# Patient Record
Sex: Female | Born: 1985 | Hispanic: Yes | Marital: Single | Smoking: Never smoker
Health system: Southern US, Community
[De-identification: ages and names within clinical notes are randomized; demographics above are authoritative.]

## PROBLEM LIST (undated history)

## (undated) ENCOUNTER — Inpatient Hospital Stay (HOSPITAL_COMMUNITY): Payer: Self-pay

## (undated) DIAGNOSIS — F419 Anxiety disorder, unspecified: Secondary | ICD-10-CM

## (undated) DIAGNOSIS — O139 Gestational [pregnancy-induced] hypertension without significant proteinuria, unspecified trimester: Secondary | ICD-10-CM

## (undated) DIAGNOSIS — F329 Major depressive disorder, single episode, unspecified: Secondary | ICD-10-CM

## (undated) HISTORY — PX: OVARIAN CYST REMOVAL: SHX89

## (undated) HISTORY — DX: Gestational (pregnancy-induced) hypertension without significant proteinuria, unspecified trimester: O13.9

## (undated) HISTORY — PX: APPENDECTOMY: SHX54

---

## 2004-08-18 ENCOUNTER — Inpatient Hospital Stay (HOSPITAL_COMMUNITY): Admission: AD | Admit: 2004-08-18 | Discharge: 2004-08-18 | Payer: Self-pay | Admitting: *Deleted

## 2004-10-03 ENCOUNTER — Ambulatory Visit (HOSPITAL_COMMUNITY): Admission: RE | Admit: 2004-10-03 | Discharge: 2004-10-03 | Payer: Self-pay | Admitting: Obstetrics & Gynecology

## 2004-12-30 ENCOUNTER — Ambulatory Visit: Payer: Self-pay | Admitting: Family Medicine

## 2004-12-30 ENCOUNTER — Inpatient Hospital Stay (HOSPITAL_COMMUNITY): Admission: AD | Admit: 2004-12-30 | Discharge: 2004-12-31 | Payer: Self-pay | Admitting: Obstetrics and Gynecology

## 2005-02-28 ENCOUNTER — Inpatient Hospital Stay (HOSPITAL_COMMUNITY): Admission: AD | Admit: 2005-02-28 | Discharge: 2005-02-28 | Payer: Self-pay | Admitting: Obstetrics & Gynecology

## 2005-02-28 ENCOUNTER — Ambulatory Visit: Payer: Self-pay | Admitting: *Deleted

## 2005-03-02 ENCOUNTER — Ambulatory Visit: Payer: Self-pay | Admitting: Family Medicine

## 2005-03-02 ENCOUNTER — Inpatient Hospital Stay (HOSPITAL_COMMUNITY): Admission: AD | Admit: 2005-03-02 | Discharge: 2005-03-05 | Payer: Self-pay | Admitting: *Deleted

## 2005-04-28 ENCOUNTER — Emergency Department (HOSPITAL_COMMUNITY): Admission: EM | Admit: 2005-04-28 | Discharge: 2005-04-28 | Payer: Self-pay | Admitting: Emergency Medicine

## 2006-01-28 ENCOUNTER — Observation Stay (HOSPITAL_COMMUNITY): Admission: EM | Admit: 2006-01-28 | Discharge: 2006-01-29 | Payer: Self-pay | Admitting: Emergency Medicine

## 2007-07-02 ENCOUNTER — Ambulatory Visit (HOSPITAL_COMMUNITY): Admission: RE | Admit: 2007-07-02 | Discharge: 2007-07-02 | Payer: Self-pay | Admitting: Family Medicine

## 2008-02-22 ENCOUNTER — Inpatient Hospital Stay (HOSPITAL_COMMUNITY): Admission: AD | Admit: 2008-02-22 | Discharge: 2008-02-24 | Payer: Self-pay | Admitting: Obstetrics

## 2008-08-11 ENCOUNTER — Encounter: Payer: Self-pay | Admitting: Sports Medicine

## 2008-08-11 ENCOUNTER — Ambulatory Visit: Payer: Self-pay | Admitting: Family Medicine

## 2008-08-11 ENCOUNTER — Inpatient Hospital Stay (HOSPITAL_COMMUNITY): Admission: AD | Admit: 2008-08-11 | Discharge: 2008-08-11 | Payer: Self-pay | Admitting: Family

## 2008-08-11 LAB — CONVERTED CEMR LAB
Antibody Screen: NEGATIVE
Basophils Absolute: 0 10*3/uL (ref 0.0–0.1)
Basophils Relative: 0 % (ref 0–1)
Eosinophils Absolute: 0.1 10*3/uL (ref 0.0–0.7)
Eosinophils Relative: 1 % (ref 0–5)
HCT: 34.4 % — ABNORMAL LOW (ref 36.0–46.0)
Hemoglobin: 12.2 g/dL (ref 12.0–15.0)
Hepatitis B Surface Ag: NEGATIVE
Lymphocytes Relative: 11 % — ABNORMAL LOW (ref 12–46)
Lymphs Abs: 1 K/uL (ref 0.7–4.0)
MCHC: 35.5 g/dL (ref 30.0–36.0)
MCV: 83.9 fL (ref 78.0–100.0)
Monocytes Absolute: 0.4 K/uL (ref 0.1–1.0)
Monocytes Relative: 5 % (ref 3–12)
Neutro Abs: 6.9 10*3/uL (ref 1.7–7.7)
Neutrophils Relative %: 82 % — ABNORMAL HIGH (ref 43–77)
Platelets: 183 K/uL (ref 150–400)
RBC: 4.1 M/uL (ref 3.87–5.11)
RDW: 15.1 % (ref 11.5–15.5)
Rh Type: POSITIVE
Rubella: 309.2 [IU]/mL — ABNORMAL HIGH
Sickle Cell Screen: NEGATIVE
WBC: 8.4 10*3/microliter (ref 4.0–10.5)

## 2008-08-13 ENCOUNTER — Inpatient Hospital Stay (HOSPITAL_COMMUNITY): Admission: AD | Admit: 2008-08-13 | Discharge: 2008-08-13 | Payer: Self-pay | Admitting: Obstetrics & Gynecology

## 2008-09-07 ENCOUNTER — Ambulatory Visit: Payer: Self-pay | Admitting: Obstetrics and Gynecology

## 2008-09-07 ENCOUNTER — Encounter: Payer: Self-pay | Admitting: Obstetrics and Gynecology

## 2008-09-07 LAB — CONVERTED CEMR LAB: hCG, Beta Chain, Quant, S: 70.7 milliintl units/mL

## 2008-09-21 ENCOUNTER — Ambulatory Visit: Payer: Self-pay | Admitting: Obstetrics and Gynecology

## 2010-03-02 IMAGING — US US OB TRANSVAGINAL
1 series · 14 of 28 positions shown · non-contrast
Comparison: 08/11/2008

CLINICAL DATA: Recent miscarriage after fetal demise.  Heavy
vaginal bleeding.

TRANSVAGINAL OB ULTRASOUND
TECHNIQUE: Transvaginal ultrasound was performed for evaluation of
the gestation as well as the maternal uterus and adnexal regions.

[Series 1: us ob transvaginal · 14 of 37 slices shown]
[im 2/37]
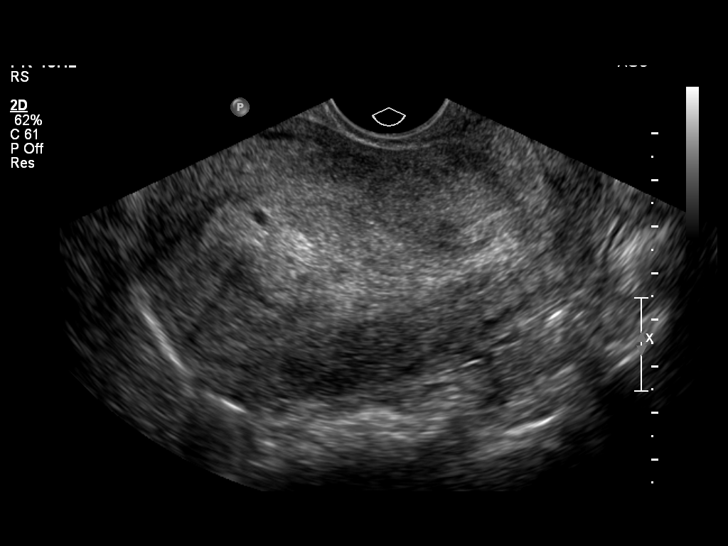
[im 5/37]
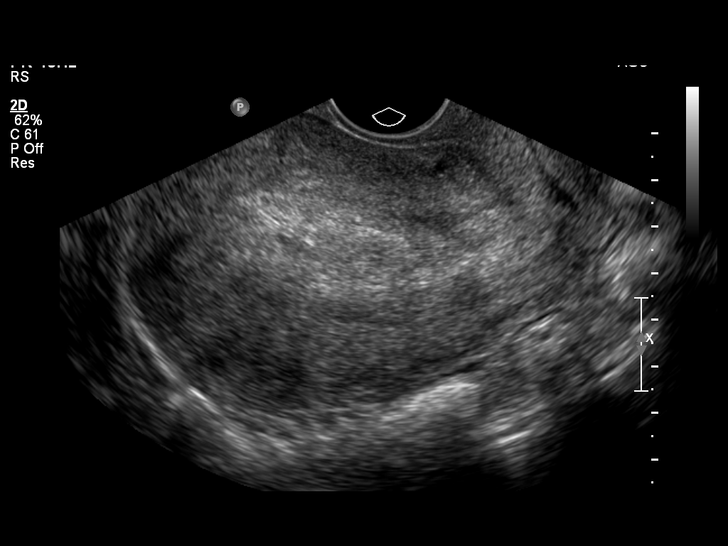
[im 7/37]
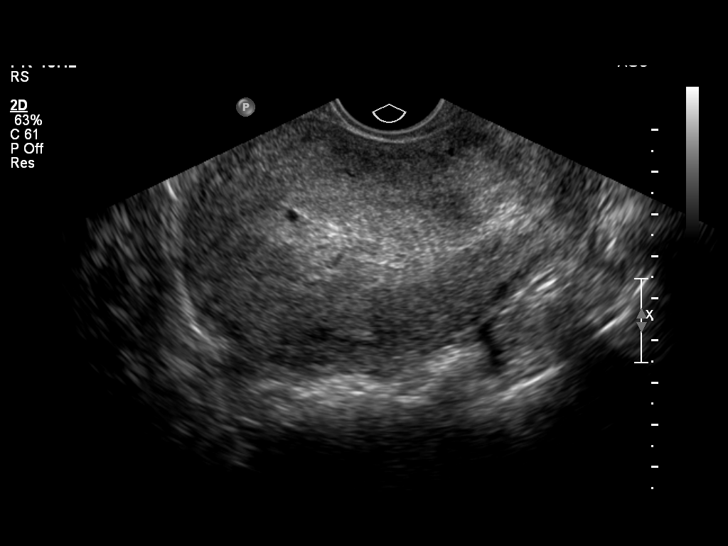
[im 10/37]
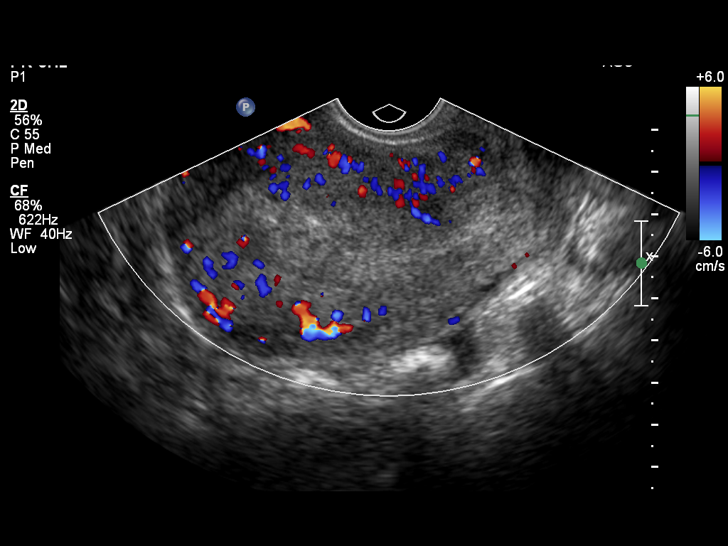
[im 13/37]
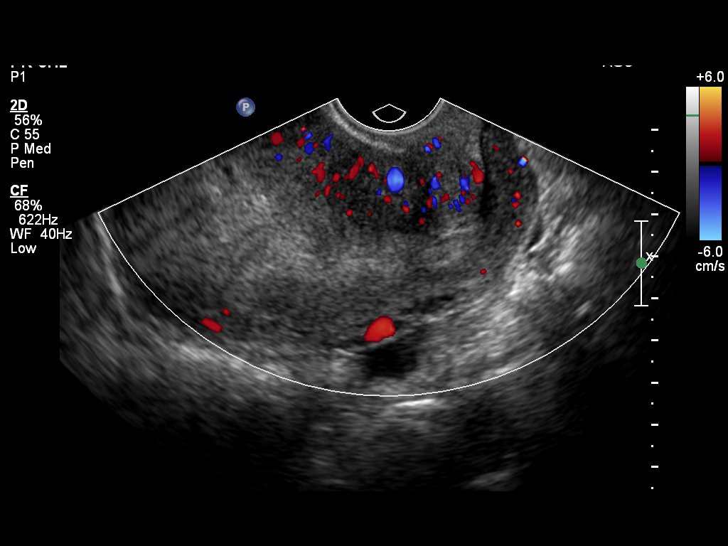
[im 15/37]
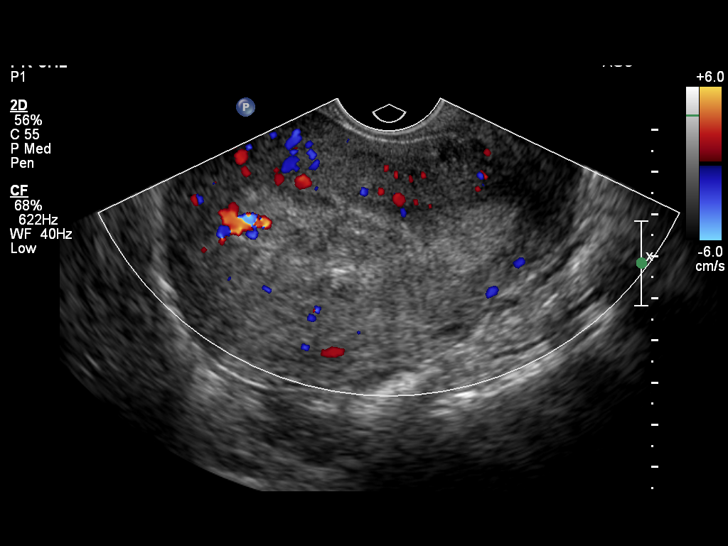
[im 18/37]
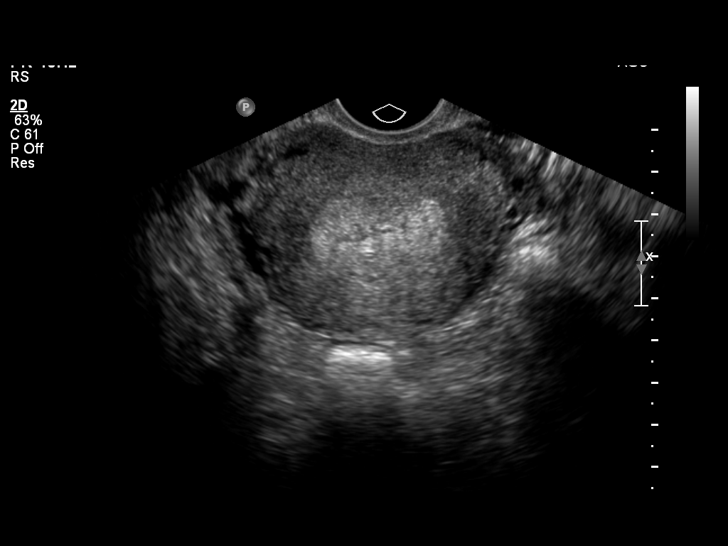
[im 21/37]
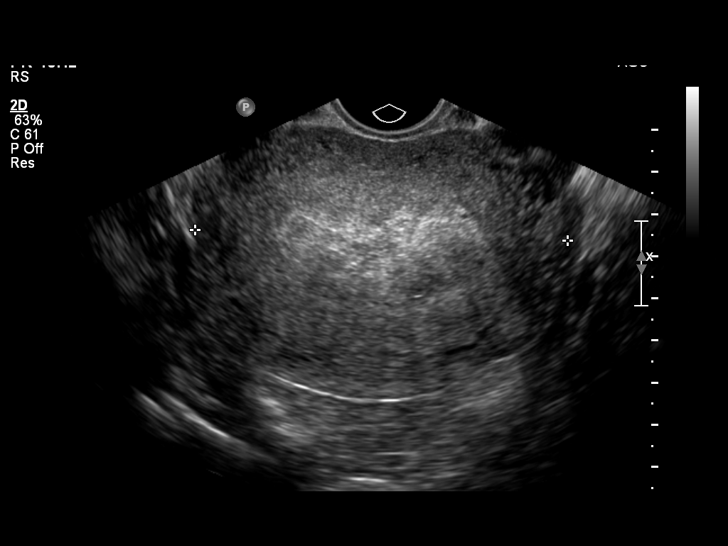
[im 23/37]
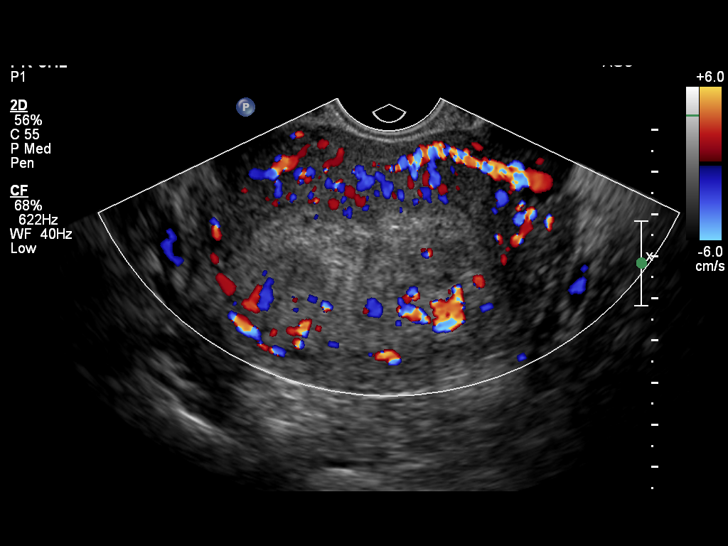
[im 26/37]
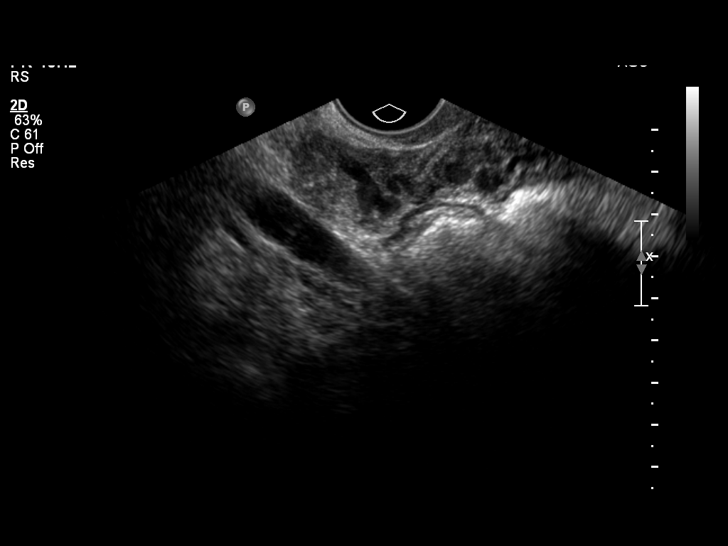
[im 29/37]
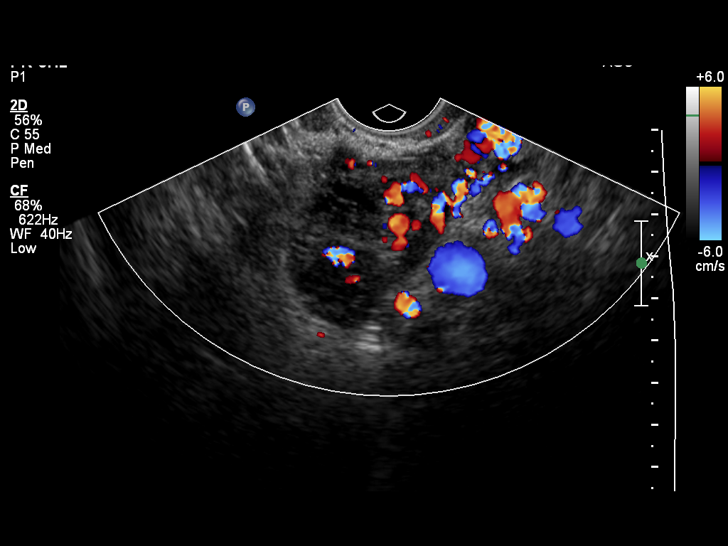
[im 31/37]
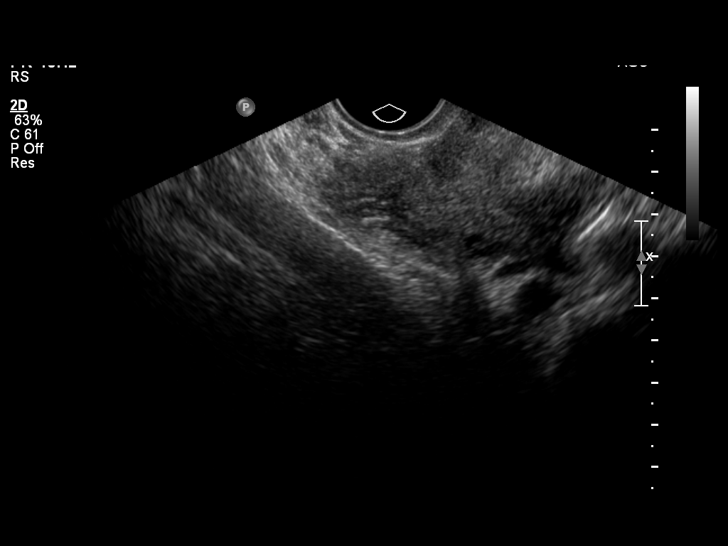
[im 34/37]
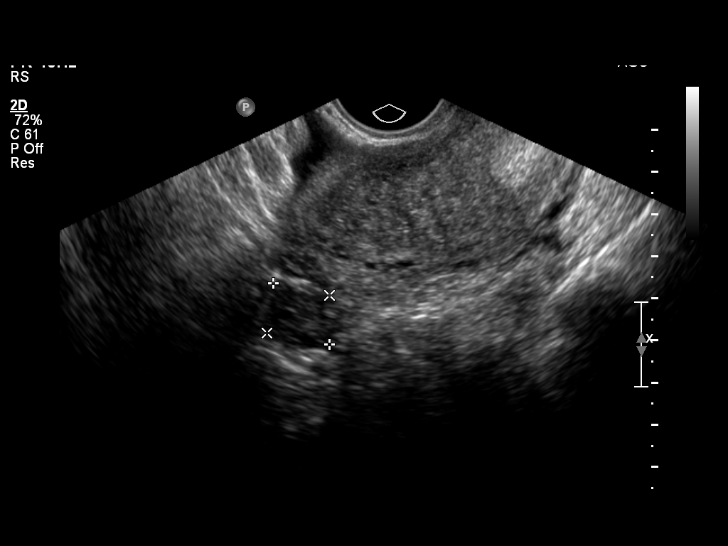
[im 37/37]
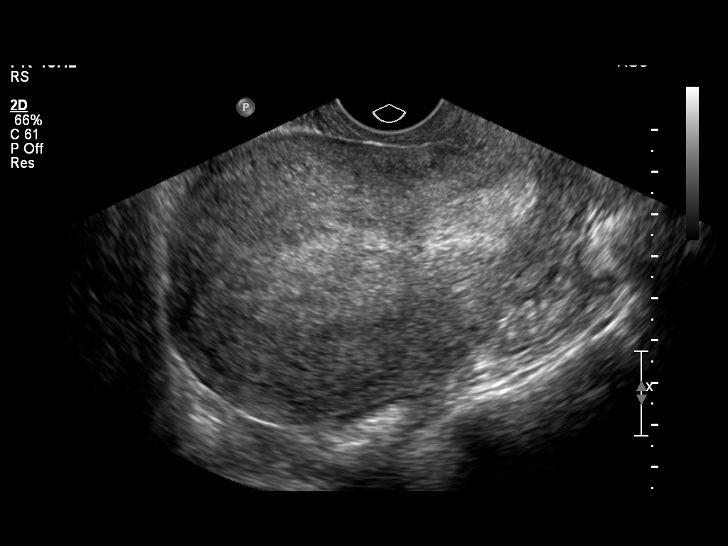

[14 of 28 positions shown; findings below may reference images not displayed]

FINDINGS: No intrauterine gestational sac is identified.  The
endometrial canal is thickened and heterogeneous, measuring up to
2.2 cm in diameter.  There is no convincing ultrasound evidence for
retained products of conception.

The ovaries are sonographically normal bilaterally.  No appreciable
fluid in the cul-de-sac.
IMPRESSION: Thickened heterogeneous endometrium likely represents blood
products.  No convincing evidence for retained products of
conception.

## 2010-05-20 ENCOUNTER — Encounter: Payer: Self-pay | Admitting: Family Medicine

## 2010-08-08 LAB — CBC
HCT: 37.1 % (ref 36.0–46.0)
Hemoglobin: 13 g/dL (ref 12.0–15.0)
MCHC: 35.7 g/dL (ref 30.0–36.0)
MCHC: 34.9 g/dL (ref 30.0–36.0)
MCV: 89.7 fL (ref 78.0–100.0)
MCV: 89.8 fL (ref 78.0–100.0)
Platelets: 179 10*3/uL (ref 150–400)
RBC: 3.58 MIL/uL — ABNORMAL LOW (ref 3.87–5.11)
RBC: 4.13 MIL/uL (ref 3.87–5.11)
WBC: 9.5 10*3/uL (ref 4.0–10.5)
WBC: 8.7 10*3/uL (ref 4.0–10.5)

## 2010-08-08 LAB — DIFFERENTIAL
Basophils Relative: 0 % (ref 0–1)
Eosinophils Absolute: 0.1 10*3/uL (ref 0.0–0.7)
Eosinophils Relative: 1 % (ref 0–5)
Lymphs Abs: 1.1 10*3/uL (ref 0.7–4.0)
Monocytes Absolute: 0.4 10*3/uL (ref 0.1–1.0)
Monocytes Relative: 4 % (ref 3–12)
Neutrophils Relative %: 82 % — ABNORMAL HIGH (ref 43–77)

## 2010-08-08 LAB — URINALYSIS, ROUTINE W REFLEX MICROSCOPIC
Glucose, UA: NEGATIVE mg/dL
Ketones, ur: NEGATIVE mg/dL
Nitrite: NEGATIVE
Protein, ur: NEGATIVE mg/dL
Urobilinogen, UA: 0.2 mg/dL (ref 0.0–1.0)

## 2010-08-08 LAB — GC/CHLAMYDIA PROBE AMP, GENITAL: GC Probe Amp, Genital: NEGATIVE

## 2010-08-09 LAB — POCT PREGNANCY, URINE: Preg Test, Ur: POSITIVE

## 2010-09-11 NOTE — H&P (Signed)
Connie Boyd, Connie Boyd       ACCOUNT NO.:  [id number]   MEDICAL RECORD NO.:  [id number]          PATIENT TYPE:  INP   LOCATION:  9169                          FACILITY:  [facility]   PHYSICIAN:  Roseanna Rainbow, M.D.DATE OF BIRTH:  09-30-1985   DATE OF ADMISSION:  02/22/2008  DATE OF DISCHARGE:                              HISTORY & PHYSICAL   CHIEF COMPLAINT:  The patient is a 25 year old para 1 with an estimated  date of confinement of February 29, 2008, with an intrauterine pregnancy  at 39 weeks complaining of contractions.   HISTORY OF PRESENT ILLNESS:  Please see the above.   ALLERGIES:  No known drug allergies.   MEDICATIONS:  None.   PRENATAL LABS:  Blood type O+, antibody screen negative.  Urine culture  and sensitivity, enterococcus, GC probe negative.  Chlamydia probe  negative.  1-hour GTT 97.  Hepatitis B surface antigen negative.  Hematocrit 34.4, hemoglobin 11.8, HIV nonreactive.  Platelets 214,000.  RPR nonreactive, rubella immune, Chlamydia negative, Varicella immune.  GBS negative on February 01, 2008.   PAST OBSTETRICAL HISTORY:  In November 2006, she delivered a live born  female, vaginal delivery full term, 5 pounds 11 ounces.   PAST GYNECOLOGIC HISTORY:  Noncontributory.   PAST MEDICAL HISTORY:  No significant history of medical diseases.   PAST SURGICAL HISTORY:  Appendectomy.   SOCIAL HISTORY:  She is single, living with her significant other.  Does  not give any significant history of alcohol usage, has no significant  smoking history.  Denies illicit drug use.   FAMILY HISTORY:  No major illnesses known.   PHYSICAL EXAM:  Vital signs stable, afebrile.  Fetal heart tracing  reassuring.  Tocodynamometer uterine contractions every 2-4 minutes.  Sterile vaginal exam per the RN: Cervix 3-4 cm dilated, 90% effaced.   ASSESSMENT:  Primipara at term, early labor.  Fetal heart tracing  consistent with fetal well being.   PLAN:  Admission, expectant  management.      Roseanna Rainbow, M.D.  Electronically Signed     LAJ/MEDQ  D:  02/22/2008  T:  02/22/2008  Job:  [id number]

## 2010-09-11 NOTE — Group Therapy Note (Signed)
NAME:  Connie Boyd, Connie Boyd NO.:  [id number]   MEDICAL RECORD NO.:  [id number]          PATIENT TYPE:  WOC   LOCATION:  [facility]                   FACILITY:  [facility]   PHYSICIAN:  Argentina Donovan, MD        DATE OF BIRTH:  [birth year]   DATE OF SERVICE:  09/07/2008                                  CLINIC NOTE   The patient is a 25 year old Spanish-speaking Hispanic female, gravida  3, para 2 0 1 2, who was seen in the [facility] last on April 17,2010 having  had what appeared to be complete AB following her previous visit with an  empty uterus.  She comes in today for follow-up.  She has not had a Pap  smear in several years.  Other than that she has decided on using the  Mirena IUD. Will put in an application for one of those and in the  interim she is going to use the Depo-Provera.  We have discussed the IUD  and Depo-Provera with the patient, and she seems to understand the  possible complications and pros and cons.   PHYSICAL EXAMINATION:  ABDOMEN: On examination her abdomen is soft,  flat, nontender.  No masses or organomegaly.  GENITOURINARY:  External genitalia is normal.  BUS is within normal  limits.  Vagina is clean with a moderate amount of pinkish blood within  the vaginal vault.  The cervix shows a very small amount of spotting  type of discharge.  The cervix is slightly fish mouth. Pap smear was  taken.  Uterus on bimanual is anterior and of normal size, shape,  consistency, and the cul-de-sac is free.   IMPRESSION:  Complete abortion.   PLAN:  Depo-Provera today and application in for a Mirena IUD.           ______________________________  Argentina Donovan, MD     PR/MEDQ  D:  09/07/2008  T:  09/07/2008  Job:  [id number]

## 2010-09-14 NOTE — Op Note (Signed)
NAMENANDA, BITTICK       ACCOUNT NO.:  [id number]   MEDICAL RECORD NO.:  [id number]          PATIENT TYPE:  INP   LOCATION:  9107                          [facility]ACILITY:  [facility]   PHYSICIAN:  Tanya S. Shawnie Pons, M.D.   DATE OF BIRTH:  1985/06/04   DATE OF PROCEDURE:  03/03/2005  DATE OF DISCHARGE:                                 OPERATIVE REPORT   OPERATION/PROCEDURE:  Vacuum-assisted delivery.   PREOPERATIVE DIAGNOSES:  1.  Fetal tachycardia.  2.  Maternal exhaustion.   SURGEON:  Shelbie Proctor. Shawnie Pons, M.D.   ASSISTANTKaroline Caldwell B. Childers, M.D.   DESCRIPTION OF PROCEDURE:  At 4122 this 25 year old, gravida 1, at 39-4/7  weeks delivered a viable female by vacuum-assisted vaginal delivery.  Vacuum  popped off x2 with one pull to the first pop-off, two pulls for the second  pop-off, and on the third attempt the baby was delivered with two pushes.  The patient was +2 station when the vacuum was applied.  Apgars were 8 and  8.  Under epidural anesthesia. Bulb suction at perineum.  Tight nuchal cord  x1.  Clamped and cut.  Infant delivered and handed to NICU team.  Cord PH  7.25.  Intact three-vessel cord placenta delivered spontaneously at 1649.  Cervix and vagina inspected.  No lacerations found.  Estimated blood loss  300 mL.  Patient and infant stable.  Dr. Shawnie Pons in attendance.     ______________________________  August Saucer. Merlene Morse, MD    ______________________________  Shelbie Proctor. Shawnie Pons, M.D.    ABC/MEDQ  D:  03/03/2005  T:  03/04/2005  Job:  [id number]

## 2010-10-28 ENCOUNTER — Emergency Department (HOSPITAL_COMMUNITY): Payer: Self-pay

## 2010-10-28 ENCOUNTER — Emergency Department (HOSPITAL_COMMUNITY)
Admission: EM | Admit: 2010-10-28 | Discharge: 2010-10-28 | Disposition: A | Discharge status: Home / Self Care | Payer: Self-pay | Attending: Emergency Medicine | Admitting: Emergency Medicine

## 2010-10-28 DIAGNOSIS — M79609 Pain in unspecified limb: Secondary | ICD-10-CM | POA: Insufficient documentation

## 2010-10-28 DIAGNOSIS — Y92009 Unspecified place in unspecified non-institutional (private) residence as the place of occurrence of the external cause: Secondary | ICD-10-CM | POA: Insufficient documentation

## 2010-10-28 DIAGNOSIS — S62639A Displaced fracture of distal phalanx of unspecified finger, initial encounter for closed fracture: Secondary | ICD-10-CM | POA: Insufficient documentation

## 2010-10-28 DIAGNOSIS — S6990XA Unspecified injury of unspecified wrist, hand and finger(s), initial encounter: Secondary | ICD-10-CM | POA: Insufficient documentation

## 2010-10-28 DIAGNOSIS — M7989 Other specified soft tissue disorders: Secondary | ICD-10-CM | POA: Insufficient documentation

## 2010-10-28 DIAGNOSIS — S6000XA Contusion of unspecified finger without damage to nail, initial encounter: Secondary | ICD-10-CM | POA: Insufficient documentation

## 2010-10-28 DIAGNOSIS — S6980XA Other specified injuries of unspecified wrist, hand and finger(s), initial encounter: Secondary | ICD-10-CM | POA: Insufficient documentation

## 2010-10-28 DIAGNOSIS — W230XXA Caught, crushed, jammed, or pinched between moving objects, initial encounter: Secondary | ICD-10-CM | POA: Insufficient documentation

## 2011-01-28 LAB — CBC
HCT: 22.1 — ABNORMAL LOW
HCT: 36.2
Hemoglobin: 12.5
MCHC: 34.3
MCHC: 34.5
MCV: 91.8
Platelets: 176
RBC: 2.4 — ABNORMAL LOW
RBC: 3.96
RDW: 13.7

## 2011-04-30 NOTE — L&D Delivery Note (Signed)
Connie Boyd is a 26 y.o. [id number] at [redacted]w[redacted]d who presented in active labor with quick progress.  At 0332 AROM for clear fluid. Pt was C/C/+1 at 0335. She underwent a NSVB of a viable female infant born ROP with a loose nuchal X 1 to maternal abdomen for drying and stimulation.  Spontaneous, lusty cry with apgars 8/9. Bulb suction for clear secretions.   Placenta delivered at 0358, complete and intact.  Pitocin bolus for 4th stage. Infant skin to skin with mother.  EBL 300 cc

## 2011-06-21 ENCOUNTER — Encounter (HOSPITAL_COMMUNITY): Payer: Self-pay

## 2011-06-21 ENCOUNTER — Inpatient Hospital Stay (HOSPITAL_COMMUNITY): Payer: Self-pay

## 2011-06-21 ENCOUNTER — Inpatient Hospital Stay (HOSPITAL_COMMUNITY)
Admission: AD | Admit: 2011-06-21 | Discharge: 2011-06-21 | Disposition: A | Discharge status: Home / Self Care | Payer: Self-pay | Source: Ambulatory Visit | Attending: Obstetrics & Gynecology | Admitting: Obstetrics & Gynecology

## 2011-06-21 DIAGNOSIS — O209 Hemorrhage in early pregnancy, unspecified: Secondary | ICD-10-CM | POA: Insufficient documentation

## 2011-06-21 DIAGNOSIS — N949 Unspecified condition associated with female genital organs and menstrual cycle: Secondary | ICD-10-CM | POA: Insufficient documentation

## 2011-06-21 DIAGNOSIS — Z331 Pregnant state, incidental: Secondary | ICD-10-CM

## 2011-06-21 DIAGNOSIS — Z3490 Encounter for supervision of normal pregnancy, unspecified, unspecified trimester: Secondary | ICD-10-CM

## 2011-06-21 LAB — URINALYSIS, ROUTINE W REFLEX MICROSCOPIC
Glucose, UA: NEGATIVE mg/dL
Nitrite: NEGATIVE
Urobilinogen, UA: 0.2 mg/dL (ref 0.0–1.0)

## 2011-06-21 LAB — POCT PREGNANCY, URINE: Preg Test, Ur: POSITIVE — AB

## 2011-06-21 LAB — WET PREP, GENITAL

## 2011-06-21 LAB — PREGNANCY, URINE: Preg Test, Ur: POSITIVE — AB

## 2011-06-21 NOTE — ED Provider Notes (Signed)
History     No chief complaint on file.  HPI This is a 26 y.o. G4 P2012 at [redacted]w[redacted]d who presents with c/o bilateral pelvic pain for 2 weeks. States it comes and goes. No bleeding or discharge. No problems with urination. Plans to go to [facility] later on. Has had no care with this pregnancy yet.   OB History    No data available      No past medical history on file.  No past surgical history on file.  No family history on file.  History  Substance Use Topics  . Smoking status: Not on file  . Smokeless tobacco: Not on file  . Alcohol Use: Not on file    Allergies: Allergies not on file  No prescriptions prior to admission    ROS As above  Physical Exam   There were no vitals taken for this visit.  Physical Exam  Constitutional: She is oriented to person, place, and time. She appears well-developed and well-nourished. No distress.  HENT:  Head: Normocephalic.  Cardiovascular: Normal rate.   Respiratory: Effort normal.  GI: Soft. She exhibits no distension and no mass. There is tenderness. There is no rebound and no guarding.  Genitourinary: Vagina normal and uterus normal. No vaginal discharge found.  Musculoskeletal: Normal range of motion.  Neurological: She is alert and oriented to person, place, and time.  Skin: Skin is warm and dry.  Psychiatric: She has a normal mood and affect.  There is CMT and uterus is tender. Some mild tenderness LLQ  Blood Type O+ from previous record MAU Course  Procedures   Assessment and Plan  A:  Pregnancy at 8 weeks per LMP      Pelvic pain, r/o ectopic P:  Cultures done      Quant HCG      Pelvic US  [facility] 06/21/2011, 11:34 AM   Results for orders placed during the hospital encounter of 06/21/11 (from the past 24 hour(s))  URINALYSIS, ROUTINE W REFLEX MICROSCOPIC     Status: Normal   Collection Time   06/21/11 11:30 AM      Component Value Range   Color, Urine YELLOW  YELLOW    APPearance CLEAR  CLEAR    Specific Gravity, Urine 1.010  1.005 - 1.030    pH 6.0  5.0 - 8.0    Glucose, UA NEGATIVE  NEGATIVE (mg/dL)   Hgb urine dipstick NEGATIVE  NEGATIVE    Bilirubin Urine NEGATIVE  NEGATIVE    Ketones, ur NEGATIVE  NEGATIVE (mg/dL)   Protein, ur NEGATIVE  NEGATIVE (mg/dL)   Urobilinogen, UA 0.2  0.0 - 1.0 (mg/dL)   Nitrite NEGATIVE  NEGATIVE    Leukocytes, UA NEGATIVE  NEGATIVE   PREGNANCY, URINE     Status: Abnormal   Collection Time   06/21/11 11:30 AM      Component Value Range   Preg Test, Ur POSITIVE (*) NEGATIVE   POCT PREGNANCY, URINE     Status: Abnormal   Collection Time   06/21/11 11:31 AM      Component Value Range   Preg Test, Ur POSITIVE (*) NEGATIVE   HCG, QUANTITATIVE, PREGNANCY     Status: Abnormal   Collection Time   06/21/11 11:36 AM      Component Value Range   hCG, Beta Chain, Quant, [place] [address] (*) <5 (mIU/mL)  WET PREP, GENITAL     Status: Abnormal   Collection Time   06/21/11 11:49 AM  Component Value Range   Yeast Wet Prep HPF POC NONE SEEN  NONE SEEN    Trich, Wet Prep NONE SEEN  NONE SEEN    Clue Cells Wet Prep HPF POC NONE SEEN  NONE SEEN    WBC, Wet Prep HPF POC FEW (*) NONE SEEN    [place]:  US Ob Comp Less 14 Wks  [place]:  .SIUP at 8.0 which measures 7.3weeks with EDC 01/31/12          FHR 152          Small SCH Will d/c home to Prenatal care

## 2011-06-21 NOTE — Progress Notes (Signed)
Pt states via Eda, spanish interpreter that she has had lower abd pain x2weeks. White vaginal d/c noted, non-odorous. Denies uti s/s.

## 2011-06-21 NOTE — Discharge Instructions (Signed)
El ABC del Embarazo (ABCs of Pregnancy) A A La atencin antes del parto es muy importante. Asegrese de consultar a su mdico y recibir atencin prenatal tan rpido como usted crea que est embarazada. En este momento, se la controlar por posibles infecciones, anormalidades genticas y problemas potenciales. Este es el momento para discutir la dieta, el ejercicio, el trabajo, los medicamentos, el parto, los medicamentos para el dolor durante el parto y la posibilidad de un parto por cesrea. Haga todas las preguntas que puedan preocuparla. Es importante que consulte a su mdico con regularidad durante todo el embarazo. Evite el contacto con sustancias txicas y productos qumicos, como disolventes de limpieza, plomo y mercurio, algunos insecticidas y pinturas. Las mujeres embarazadas deben evitar la exposicin a los vapores de pintura, y los humos que la hagan sentir enferma, mareada o dbil. Cuando sea posible, tenga una consulta con su mdico antes del embarazo para recibir recomendaciones que le pudiera sugerir, como tomar cido flico, hacer ejercicios, dejar de fumar, evitar las bebidas alcohlicas, etc. B La lactancia materna es la opcin ms saludable para usted y su beb. Tiene muchos beneficios nutricionales para al beb y su salud. Tambin crea un vnculo muy estrecho entre el beb y la madre. Hable con su mdico, su familia, sus amigos y su empleador acerca de cmo usted decidir alimentar a su beb y como pueden ayudarle a decidir. No todos los defectos congnitos pueden ser evitados, pero una mujer puede tomar decisiones para aumentar las posibilidades de tener un beb sano. Muchos defectos congnitos ocurren al principio del embarazo, a veces antes de que la mujer sepa que est embarazada. Los defectos o anormalidades congnitas de cualquier nio de su familia o la del padre deben ser comentados con su mdico. Obtener un buen sostn para los cambios del tamao de las mamas. selo en especial  cuando hace ejercicios o cuando amamante.  C Festeje la noticia de su embarazo con su Cnyuge/padre y su familia. Las Clases de parto son tiles para usted y su cnyuge/padre, porque le ayudan a entender lo que sucede durante el embarazo y el parto. El parto por cesrea se debe discutir con el mdico para estar preparado para esa posibilidad. Las ventajas y las desventajas de la [place] si es un nio, se deben comentar con el pediatra. El tabaquismo durante el embarazo puede dar como resultado bebs con bajo peso al nacer. Se ha asociado con la infertilidad, abortos espontneos, embarazos ectpicos, mortalidad infantil y la mala salud (morbilidad) en la infancia. Adems, el tabaquismo puede causar problemas de aprendizaje a largo plazo. Si usted fuma, debe tratar de dejar de fumar antes de quedar embarazada y no durante el embarazo. El humo secundario tambin puede hacerles dao a la mam y a su beb en desarrollo. Es una buena idea pedirle a la gente que deje de fumar a su alrededor durante el embarazo y despus del nacimiento del beb. El [place] extra es necesario durante el embarazo y se encuentra en las vitaminas prenatales, en los productos lcteos, vegetales de hojas verdes y en los suplementos de calcio. D Una Dieta saludable de acuerdo a su peso y su talla actual, junto con las vitaminas y suplementos minerales debe ser discutida con su mdico. El abuso /o la violencia Domstica deben darse a conocer inmediatamente para corregir la situacin. Tome ms agua cuando haga ejercicios para mantenerse hidratada. Por lo general las molestias de la espalda y las piernas aparecen y avanzan a partir de mediados del segundo   trimestre hasta el nacimiento del beb. Eso se debe al aumento de tamao del beb y del tero, que tambin puede afectar su equilibro. No consuma Drogas. Las drogas ilegales pueden daar seriamente al beb y a usted. Beba ms lquidos (lo mejor es el agua) Durante todo el embarazo para  ayudar a su cuerpo a mantener el aumento del volumen sanguneo. Beba por lo menos 6 a 8 vasos de agua, jugo de frutas o leche por da. Una buena manera de saber que est bebiendo suficiente lquido es cuando la orina se ve casi como el agua clara o de color amarillo muy claro.  E Coma alimentos sanos para obtener los nutrientes que usted y su beb necesitan al nacer. Sus alimentos deben incluir los cinco grupos bsicos. El Ejercicio (30 minutos de ejercicio leve a moderado al da) es importante y se aconseja durante el embarazo, si no tiene problemas de salud. Si el ejercicio le causa malestar o mareos debe interrumpirlo e informar a su mdico. Las emociones durante el embarazo pueden pasar de la euforia a la depresin y deben ser comprendidos por usted, su pareja y su familia. F La evaluacin fetal con ecografas, la amniocentesis y los controles durante el embarazo y el parto son algo habitual y a veces necesario. Tome 400 mg. de cido Flico todos los das, si es posible antes y durante los primeros meses del embarazo para reducir el riesgo de defectos congnitos del cerebro y la columna vertebral. Todas las mujeres que podran quedar embarazadas deben tomar una vitamina con cido flico todos los das. Tambin es importante seguir una dieta saludable con alimentos enriquecidos (cereales enriquecidos, arroz, panes y pastas) y alimentos que sean fuentes naturales de cido flico (jugo de naranja, vegetales de hojas verdes, frijoles, man, brcoli, esprragos, arvejas y lentejas). El padre debe involucrarse en todos los aspectos del embarazo incluyendo el cuidado prenatal, las clases de parto, el parto y el tiempo despus del parto. Los padres tambin pueden tener problemas emocionales como ser padres, problemas econmicos y llevar adelante una Familia. G Las pruebas Genticas se deben hacer correctamente. Es importante conocer la historia de su familia y la del padre. Si ha habido problemas con embarazos o  defectos de nacimiento en su familia, informe de inmediato a su mdico. Adems, los consejeros genticos pueden hablar con usted acerca de la informacin que pueda necesitar en la toma de decisiones acerca de tener una familia. Usted puede llamar a un centro mdico en su rea para ayudar en la bsqueda de un consejero gentico certificado por el consejo. La asesora y prueba gentica se deben hacer antes del embarazo siempre que sea posible, especialmente si hay antecedentes de problemas en la familia del padre o de la madre. Ciertos grupos tnicos tienen un mayor riesgo de defectos genticos. H Familiarcese con el Hospital en el que va a tener su beb. Conozca cunto tiempo se tarda en llegar, la sala de preparto y nacimiento y los procedimientos del hospital. Asegrese de que su seguro mdico es aceptado en ese lugar. Haga que su Hogar est listo para el beb, incluyendo la ropa, la habitacin del beb (cuando sea posible), muebles y asientos del automvil. El lavado de manos es importante durante todo el da, especialmente despus de tocar carne cruda y aves de corral, de cambiar el paal del beb y de ir al bao. Esto puede ayudarle a prevenir la propagacin de bacterias y virus que causan infecciones. Su pelo puede volverse seco y ms fino, pero   volver a la normalidad unas semanas despus de que nazca el beb. La acidez es un problema comn que puede ser tratado tomando anticidos recetados por su mdico, comiendo alimentos en porciones ms pequeas 5  6 veces por da, no beber lquidos al comer, beber entre comidas y elevar la cabecera de su cama 2  3 pulgadas. I El seguro que le d cobertura a usted y al beb, a los gastos del mdico y del hospital debe ser revisado, para saber con anticipacin qu gastos no cubiertos por su plan de seguro deber pagar usted. Si no tiene seguro mdico, por lo general hay clnicas y servicios disponibles para usted en su comunidad. Tome 30 mg. de hierro durante el  [employer] segn lo prescrito por su mdico para reducir el riesgo de niveles bajos de glbulos rojos (anemia) mas adelante en el embarazo. Todas las mujeres en edad frtil deben consumir una dieta rica en hierro. J La madre, el padre y los otros hijos deben hacer un esfuerzo conjunto para adaptarse al [employer] en el aspecto econmico, emocional y psicolgico durante Firefighter. nase a un grupo de apoyo para las futuras [place]. O bien, vaya a clases de crianza de hijos o del parto. La familia tiene que participar cuando sea posible. K Conozca sus lmites. Infrmele a su mdico si experimenta:   Cualquier tipo de dolor.   Clicos fuertes.   Aumenta de peso en un corto perodo de tiempo (5 libras en 3 a 5 das).   Hemorragia vaginal, prdida de lquido amnitico.   Dolor de [place], problemas de visin.   Mareos, [employer], le falta el aire.   Dolor en el pecho.   Fiebre de 102 F (38.9 C) o ms.   Elimina lquido claro por la vagina.   Dolor al Beatrix Shipper.   Violencia familiar.   Latidos irregulares del corazn (palpitaciones).   Latidos rpidos del corazn (taquicardia).   Siente ganas de vomitar (nuseas) y vmitos.   Dificultad para caminar, retencin de lquidos (edema).   Debilidad muscular.   Si su beb tiene disminucin de [place].   Diarrea persistente.   Flujo vaginal anormal.   Contracciones uterinas en intervalos de 20 minutos.   Dolor de espalda que baja por la pierna.  L Aprenda y practique que, Lo que usted come y bebe debe ser con moderacin y sano para usted y su beb. Las drogas [employer] el alcohol y la cafena son temas importantes para las mujeres [place]. No hay una cantidad segura de alcohol que una mujer pueda beber durante el [place]. El sndrome de alcoholismo fetal, un trastorno que se caracteriza por retraso del crecimiento, anormalidades faciales y disfuncin del sistema nervioso central, es causado por el uso de alcohol de la mujer  durante el [place]. La cafena, que se encuentra en el t, caf, refrescos y chocolate, tambin deben ser limitados. Asegrese de leer las etiquetas cuando se trata de reducir el consumo de cafena durante el [place]. Ms de 200 alimentos, bebidas y medicamentos sin receta contienen cafena y tienen un ato contenido de sal! Hay cafs y tes que no contienen cafena.  M Las enfermedades mdicas tales como diabetes, epilepsia e hipertensin arterial deben ser tratados y mantenidos bajo control antes del embarazo siempre que sea posible, pero especialmente durante el [place]. Consulte con su mdico acerca de cualquier medicamento que pueda ser necesario cambiar o ajustar la dosis. Si est tomando algn medicamento, consulte con su mdico sobre su seguridad para Engineer, site  o antes de quedar embarazada, cuando sea posible. Tambin, asegrese de informar todas las hierbas o vitaminas que est tomando. Tambin se trata de medicamentos! Hable con su mdico sobre [employer] Omaha, con receta y de venta libre que est tomando. Durante su visita prenatal, discuta los [employer] su mdico le puede dar durante el parto y el nacimiento. N Nunca tenga miedo de preguntar a su mdico acerca de su salud, el progreso del [place], problemas familiares, situaciones de estrs y pedirle la recomendacin de un pediatra, si no tiene uno. Es mejor tomar todas las precauciones y Administrator, Civil Service pregunta o preocupacin que usted tenga durante las visitas a su consultorio. Es Neomia Dear buena idea escribir sus preguntas antes de visitar al [employer]. O Los medicamentos de [place] para tos y resfriados pueden contener alcohol u otros ingredientes que se deben Theatre manager. Consulte con su mdico sobre los [employer] Scripts, hierbas o medicamentos de venta libre que est tomando o puede tomar durante el Psychiatrist.  P La actividad fsica durante el embarazo puede beneficiarla tanto a usted  y a su beb al disminuir la incomodidad y la fatiga produciendo una sensacin de [place], y el aumento de la probabilidad de una pronta recuperacin despus del parto. El ejercicio ligero a moderado Academic librarian fortalece el vientre (abdomen) y msculos de la espalda. Esto le ayuda a Banker. Practicar yoga, caminar, nadar y montar en una bicicleta fija son por lo general ejercicios seguros para las mujeres embarazadas. Evite el buceo, ejercicios de gran altura (ms de 3000 pies), esqu, paseos a caballo, deportes de contacto, etc. Consulte siempre con su mdico antes de comenzar cualquier tipo de ejercicio, Especialmente durante el embarazo y, especialmente si no hacan ejercicios antes de quedar embarazada. Q Las nuseas y Dentist estomacal son comunes durante el Psychiatrist. Coma un par de galletitas o tostadas secas antes de levantarse de la cama. Los alimentos que normalmente le gustan pueden hacerla sentir mal del [place]. Puede que tenga que sustituir otros alimentos nutritivos. Comer cinco o seis porciones pequeas al Geophysical data processor de tres grandes pueden hacer que usted se sienta mejor. No beba con sus alimentos, beba [employer]. Las preguntas Que usted tiene deben estar escritas y consultadas durante sus visitas prenatales. R Lea y haga planes para que su casa sea segura para el beb. Hay consejos importantes para que su hogar sea un ambiente ms seguro para su beb. Revise los consejos y haga su hogar ms seguro para usted y su beb. Lea las etiquetas de los alimentos con respecto a las caloras, sal y [employer]. S Las salas de saunas, baeras de [place] caliente y vapor deben evitarse durante Firefighter. El calor excesivo puede ser perjudicial durante el [place]. Su mdico la examinar para Hotel manager de transmisin sexual y trastornos genticos durante las visitas prenatales. Aprenda los Signos del [place] de [place]. Las relaciones Sexuales durante  el Psychiatrist son seguras a menos que haya un problema mdico o del Psychiatrist y su mdico le recomiende evitarlas. T Se debe evitar viajar largas distancias, especialmente en el tercer trimestre de su embarazo. Si tiene que viajar afuera del [place], asegrese de llevar una copia de sus registros mdicos y un plan de seguro mdico. Usted no debe recorrer largas distancias sin consultar con su mdico primero. La mayora de las campaas areas no permiten viajar despus de 36 semanas de embarazo. La Toxoplasmosis es una infeccin causada por un parsito  que puede daar seriamente al beb nonato. Evite comer carne poco cocida y tocar la arena higinica para gatos. Asegrese de usar guantes para la Automotive engineer. El hormigueo de las manos y los dedos no es inusual y se debe a la retencin de lquidos. Esto desaparece despus del nacimiento del beb. U Aumenta el tamao del tero durante Designer, fashion/clothing. Sus riones comenzarn a funcionar ms eficientemente. Esto puede hacer que Usted sienta la necesidad de orinar ms a menudo. Tambin puede tener fugas de orina al estornudar, toser o rer. Esto se debe a que el crecimiento del tero presiona la vejiga, que se encuentra directamente adelante y ligeramente por debajo del tero durante los primeros meses del [place]. Si experimenta ardor con frecuencia al Beatrix Shipper o en la orina presenta sangre, asegrese de decirle a su mdico. El tamao de su tero en el tercer trimestre puede causar un problema con el equilibro. Es recomendable mantener una buena postura y Automotive engineer usar tacones altos durante ese tiempo. Un Ultrasonido de su beb puede ser necesario durante el embarazo y es seguro para usted y su beb. V Las vacunas son Neomia Dear preocupacin importante para las mujeres embarazadas. Pngase las vacunas necesarias antes del embarazo. El centro para el control de enfermedades ([email or web address]) tiene directrices claras para el uso de vacunas durante el [place]. Revise la lista,  augrese de Science writer con su mdico. Las Vitaminas prenatales son tiles y saludables para usted y el beb. No tome otras vitaminas, excepto lo que se recomiende. Tomar algunas vitaminas en exceso puede causar problemas de sobredosis. Los vmitos continuos deben ser reportados a su mdico. Las venas Varicosas pueden aparecer sobre todo si hay antecedentes familiares. Deben desaparecer despus del nacimiento del beb. Usar calzas ayuda si no le producen molestias. W Tener sobrepeso o bajo peso durante el embarazo puede causarle problemas. Trate de estar en un rango de 15 libras de su peso ideal antes del Psychiatrist. Recuerde, el embarazo no es el momento para estar a dieta! No deje de comer o saltee las comidas si aumenta de [place]. Tanto usted como su beb necesitan las caloras y la nutricin que recibe de una dieta saludable. Asegrese de Science writer con su mdico acerca de su dieta. Hay un plan de frmula y dieta disponibles en funcin de si tiene sobrepeso o bajo peso. Su mdico o nutricionista puede ayudarle y asesorarle si es necesario. X Evite los rayos X. Si usted tiene que hacerse un tratamiento dental o pruebas diagnsticas, informe a su dentista o mdico que est embarazada para eventualmente tomar cuidados extra. Los rayos X slo se deben tomar The Sherwin-Poppy Mcafee de no tomarlos son mayores que el riesgo de tomarlos. Si es necesario, slo una cantidad mnima de radiacin debe ser [place]. Cuando los rayos X son necesarios, se deben usar los escudos protectores de plomo para cubrir las reas del cuerpo que no necesiten visualizarse en la radiografa. Y Su beb la ama. Amamantar a su beb crea un vnculo amoroso y [employer]. Dle a su beb un medio ambiente sano para vivir durante el embarazo. Los bebs y nios requieren un cuidado y [place] orientacin constante. Su salud y su seguridad deben ser vigiladas cuidadosamente en todo momento. Despus de que nazca el beb, descanse o duerma una  siesta cuando el beb est durmiendo. Z Consiga descansar. Asegrese de obtener suficiente descanso. Descanse de lado tan a menudo como sea posible, especialmente se aconseja el lado izquierdo. Proporciona la mejor circulacin para su beb  y [place] a reducir la hinchazn. Trate de tomar una siesta de treinta a cuarenta y cinco minutos en la tarde cuando sea posible. Despus de que nazca el beb, descanse o duerma una siesta cuando el beb est durmiendo. Trate de elevar los pies cuando le sea posible. Ayuda a la circulacin en las piernas y a Building services engineer. La mayora de informacin es de cortesa de CDC. Document Released: 02/10/2007 Document Revised: 12/26/2010 [facility] Patient Information 2012 [place], [place].

## 2011-06-22 LAB — GC/CHLAMYDIA PROBE AMP, GENITAL: Chlamydia, DNA Probe: NEGATIVE

## 2011-10-09 ENCOUNTER — Encounter: Payer: Self-pay | Admitting: Physician Assistant

## 2011-10-09 ENCOUNTER — Ambulatory Visit (INDEPENDENT_AMBULATORY_CARE_PROVIDER_SITE_OTHER): Payer: Self-pay | Admitting: Family Medicine

## 2011-10-09 VITALS — BP 108/67 | Temp 99.3°F | Wt 129.3 lb

## 2011-10-09 DIAGNOSIS — O0930 Supervision of pregnancy with insufficient antenatal care, unspecified trimester: Secondary | ICD-10-CM

## 2011-10-09 DIAGNOSIS — Z3480 Encounter for supervision of other normal pregnancy, unspecified trimester: Secondary | ICD-10-CM | POA: Insufficient documentation

## 2011-10-09 LAB — DIFFERENTIAL
Basophils Absolute: 0 10*3/uL (ref 0.0–0.1)
Lymphocytes Relative: 13 % (ref 12–46)
Neutro Abs: 7 10*3/uL (ref 1.7–7.7)

## 2011-10-09 LAB — CBC
Platelets: 179 10*3/uL (ref 150–400)
RDW: 14.4 % (ref 11.5–15.5)
WBC: 8.5 10*3/uL (ref 4.0–10.5)

## 2011-10-09 LAB — POCT URINALYSIS DIP (DEVICE)
Glucose, UA: NEGATIVE mg/dL
Ketones, ur: NEGATIVE mg/dL
Specific Gravity, Urine: 1.02 (ref 1.005–1.030)
Urobilinogen, UA: 0.2 mg/dL (ref 0.0–1.0)

## 2011-10-09 LAB — HEPATITIS B SURFACE ANTIGEN: Hepatitis B Surface Ag: NEGATIVE

## 2011-10-09 NOTE — Progress Notes (Signed)
Pulse 78 Has painful urination

## 2011-10-09 NOTE — Progress Notes (Signed)
Patient seen and examined.  Agree with above note.

## 2011-10-09 NOTE — Progress Notes (Signed)
   Subjective:    Connie Boyd is a [id number] [redacted]w[redacted]d being seen today for her first obstetrical visit.  Her obstetrical history is significant for 2 prior term SVDs.  Pregnancy history fully reviewed.  Patient reports backache, no bleeding, no contractions, no cramping, no leaking, vaginal irritation and irritation with urination.  Filed Vitals:   10/09/11 0817  BP: 108/67  Temp: 99.3 F (37.4 C)  Weight: 129 lb 4.8 oz (58.65 kg)    HISTORY: OB History    Grav Para Term Preterm Abortions TAB SAB Ect Mult Living   4 2 2  1  1   2      # Outc Date GA Lbr Len/2nd Wgt Sex Del Anes PTL Lv   1 TRM 11/06 [redacted]w[redacted]d   M SVD EPI No Yes   2 TRM 10/09 [redacted]w[redacted]d   F SVD None No Yes   3 SAB            4 CUR              Past Medical History  Diagnosis Date  . No pertinent past medical history    Past Surgical History  Procedure Date  . Appendectomy   . Ovarian cyst removal    Family History  Problem Relation Age of Onset  . Anesthesia problems Neg Hx   . Cancer Paternal Uncle   . Diabetes Maternal Grandfather      Exam    Uterus:  Fundal Height: 24 cm  Pelvic Exam:    Perineum: Normal Perineum   Vulva: normal   Vagina:  normal mucosa, curdlike discharge, no palpable nodules, wet prep done   Cervix: multiparous appearance, no bleeding following Pap, no cervical motion tenderness and no lesions   Adnexa: normal adnexa   Bony Pelvis: average   Skin: normal coloration and turgor, no rashes    Neurologic: oriented, normal, normal mood, grossly non-focal, oriented, normal mood   Extremities: normal strength, tone, and muscle mass, no deformities   HEENT extra ocular movement intact, sclera clear, anicteric and oropharynx clear, no lesions   Mouth/Teeth mucous membranes moist, pharynx normal without lesions   Neck supple   Cardiovascular: regular rate and rhythm   Respiratory:  appears well, vitals normal, no respiratory distress, acyanotic, normal RR, ear and throat exam is  normal, neck free of mass or lymphadenopathy, chest clear, no wheezing, crepitations, rhonchi, normal symmetric air entry   Abdomen: gravid; normal BS, soft, non-tender   Urinary: urethral meatus normal      Assessment:    Pregnancy: [id number] Patient Active Problem List  Diagnosis  . Supervision of normal subsequent pregnancy        Plan:     Initial labs drawn. Prenatal vitamins. Problem list reviewed and updated. Genetic Screening - To late to [facility]. Marland Kitchen  Ultrasound discussed; fetal survey: requested; will referr for [employer].  Follow up in 4 weeks. 50% of 30 min visit spent on counseling and coordination of care.     Tanganyika Bowlds 10/09/2011

## 2011-10-09 NOTE — Progress Notes (Signed)
Ob detail [place] scheduled on 10/16/11 @ 0930.

## 2011-10-09 NOTE — Patient Instructions (Addendum)
Embarazo - Segundo trimestre (Pregnancy - Second Trimester) El segundo trimestre del embarazo (del 3 al 6mes) es un perodo de evolucin rpida para usted y el beb. Hacia el final del sexto mes, el beb mide aproximadamente 23 cm y pesa 680 g. Comenzar a sentir los movimientos del beb entre las 18 y las 20 semanas de embarazo. Podr sentir las pataditas ("quickening en ingls"). Hay un rpido aumento de peso. Puede segregar un lquido claro (calostro) de las mamas. Quizs sienta pequeas contracciones en el vientre (tero) Esto se conoce como falso trabajo de parto o contracciones de Braxton-Hicks. Es como una prctica del trabajo de parto que se produce cuando el beb est listo para salir. Generalmente los problemas de vmitos matinales ya se han superado hacia el final del primer trimestre. Algunas mujeres desarrollan pequeas manchas oscuras (que se denominan cloasma, mscara del embarazo) en la cara que normalmente se van luego del nacimiento del beb. La exposicin al sol empeora las manchas. Puede desarrollarse acn en algunas mujeres embarazadas, y puede desaparecer en aquellas que ya tienen acn. EXAMENES PRENATALES  Durante los exmenes prenatales, deber seguir realizando pruebas de sangre, segn avance el embarazo. Estas pruebas se realizan para controlar su salud y la del beb. Tambin se realizan anlisis de sangre para conocer los niveles de hemoglobina. La anemia (bajo nivel de hemoglobina) es frecuente durante el embarazo. Para prevenirla, se administran hierro y vitaminas. Tambin se le realizarn exmenes para saber si tiene diabetes entre las 24 y las 28 semanas del embarazo. Podrn repetirle algunas de las pruebas que le hicieron previamente.   En cada visita le medirn el tamao del tero. Esto se realiza para asegurarse de que el beb est creciendo correctamente de acuerdo al estado del embarazo.   Tambin en cada visita prenatal controlarn su presin arterial. Esto se realiza  para asegurarse de que no tenga toxemia.   Se controlar su orina para asegurarse de que no tenga infecciones, diabetes o protena en la orina.   Se controlar su peso regularmente para asegurarse que el aumento ocurre al ritmo indicado. Esto se hace para asegurarse que usted y el beb tienen una evolucin normal.   En algunas ocasiones se realiza una prueba de ultrasonido para confirmar el correcto desarrollo y evolucin del beb. Esta prueba se realiza con ondas sonoras inofensivas para el beb, de modo que el profesional pueda calcular ms precisamente la fecha del parto.  Algunas veces se realizan pruebas especializadas del lquido amnitico que rodea al beb. Esta prueba se denomina amniocentesis. El lquido amnitico se obtiene introduciendo una aguja en el vientre (abdomen). Se realiza para controlar los cromosomas en aquellos casos en los que existe alguna preocupacin acerca de algn problema gentico que pueda sufrir el beb. En ocasiones se lleva a cabo cerca del final del embarazo, si es necesario inducir al parto. En este caso se realiza para asegurarse que los pulmones del beb estn lo suficientemente maduros como para que pueda vivir fuera del tero. CAMBIOS QUE OCURREN EN EL SEGUNDO TRIMESTRE DEL EMBARAZO Su organismo atravesar numerosos cambios durante el embarazo. Estos pueden variar de una persona a otra. Converse con el profesional que la asiste acerca los cambios que usted note y que la preocupen.  Durante el segundo trimestre probablemente sienta un aumento del apetito. Es normal tener "antojos" de ciertas comidas. Esto vara de una persona a otra y de un embarazo a otro.   El abdomen inferior comenzar a abultarse.   Podr tener la necesidad   de orinar con ms frecuencia debido a que el tero y el beb presionan sobre la vejiga. Tambin es frecuente contraer ms infecciones urinarias durante el embarazo (dolor al orinar). Puede evitarlas bebiendo gran cantidad de lquidos y  vaciando la vejiga antes y despus de mantener relaciones sexuales.   Podrn aparecer las primeras estras en las caderas, abdomen y mamas. Estos son cambios normales del cuerpo durante el embarazo. No existen medicamentos ni ejercicios que puedan prevenir estos cambios.   Es posible que comience a desarrollar venas inflamadas y abultadas (varices) en las piernas. El uso de medias de descanso, elevar sus pies durante 15 minutos, 3 a 4 veces al da y limitar la sal en su dieta ayuda a aliviar el problema.   Podr sentir acidez gstrica a medida que el tero crece y presiona contra el estmago. Puede tomar anticidos, con la autorizacin de su mdico, para aliviar este problema. Tambin es til ingerir pequeas comidas 4 a 5 veces al da.   La constipacin puede tratarse con un laxante o agregando fibra a su dieta. Beber grandes cantidades de lquidos, comer vegetales, frutas y granos integrales es de gran ayuda.   Tambin es beneficioso practicar actividad fsica. Si ha sido una persona activa hasta el embarazo, podr continuar con la mayora de las actividades durante el mismo. Si ha sido menos activa, puede ser beneficioso que comience con un programa de ejercicios, como realizar caminatas.   Puede desarrollar hemorroides (vrices en el recto) hacia el final del segundo trimestre. Tomar baos de asiento tibios y utilizar cremas recomendadas por el profesional que lo asiste sern de ayuda para los problemas de hemorroides.   Tambin podr sentir dolor de espalda durante este momento de su embarazo. Evite levantar objetos pesados, utilice zapatos de taco bajo y mantenga una buena postura para ayudar a reducir los problemas de espalda.   Algunas mujeres embarazadas desarrollan hormigueo y adormecimiento de la mano y los dedos debido a la hinchazn y compresin de los ligamentos de la mueca (sndrome del tnel carpiano). Esto desaparece una vez que el beb nace.   Como sus pechos se agrandan,  necesitar un sujetador ms grande. Use un sostn de soporte, cmodo y de algodn. No utilice un sostn para amamantar hasta el ltimo mes de embarazo si va a amamantar al beb.   Podr observar una lnea oscura desde el ombligo hacia la zona pbica denominada linea nigra.   Podr observar que sus mejillas se ponen coloradas debido al aumento de flujo sanguneo en la cara.   Podr desarrollar "araitas" en la cara, cuello y pecho. Esto desaparece una vez que el beb nace.  INSTRUCCIONES PARA EL CUIDADO DOMICILIARIO  Es extremadamente importante que evite el cigarrillo, hierbas medicinales, alcohol y las drogas no prescriptas durante el embarazo. Estas sustancias qumicas afectan la formacin y el desarrollo del beb. Evite estas sustancias durante todo el embarazo para asegurar el nacimiento de un beb sano.   La mayor parte de los cuidados que se aconsejan son los mismos que los indicados para el primer trimestre del embarazo. Cumpla con las citas tal como se le indic. Siga las instrucciones del profesional que lo asiste con respecto al uso de los medicamentos, el ejercicio y la dieta.   Durante el embarazo debe obtener nutrientes para usted y para su beb. Consuma alimentos balanceados a intervalos regulares. Elija alimentos como carne, pescado, leche y otros productos lcteos descremados, vegetales, frutas, panes integrales y cereales. El profesional le informar cul es el   aumento de peso ideal.   Las relaciones sexuales fsicas pueden continuarse hasta cerca del fin del embarazo si no existen otros problemas. Estos problemas pueden ser la prdida temprana (prematura) de lquido amnitico de las membranas, sangrado vaginal, dolor abdominal u otros problemas mdicos o del embarazo.   Realice actividad fsica todos los das, si no tiene restricciones. Consulte con el profesional que la asiste si no sabe con certeza si determinados ejercicios son seguros. El mayor aumento de peso tiene lugar  durante los ltimos 2 trimestres del embarazo. El ejercicio la ayudar a:   Controlar su peso.   Ponerla en forma para el parto.   Ayudarla a perder peso luego de haber dado a luz.   Use un buen sostn o como los que se usan para hacer deportes para aliviar la sensibilidad de las mamas. Tambin puede serle til si lo usa mientras duerme. Si pierde calostro, podr utilizar apsitos en el sostn.   No utilice la baera con agua caliente, baos turcos y saunas durante el embarazo.   Utilice el cinturn de seguridad sin excepcin cuando conduzca. Este la proteger a usted y al beb en caso de accidente.   Evite comer carne cruda, queso crudo, y el contacto con los utensilios y desperdicios de los gatos. Estos elementos contienen grmenes que pueden causar defectos de nacimiento en el beb.   El segundo trimestre es un buen momento para visitar a su dentista y evaluar su salud dental si an no lo ha hecho. Es importante mantener los dientes limpios. Utilice un cepillo de dientes blando. Cepllese ms suavemente durante el embarazo.   Es ms fcil perder algo de orina durante el embarazo. Apretar y fortalecer los msculos de la pelvis la ayudar con este problema. Practique detener la miccin cuando est en el bao. Estos son los mismos msculos que necesita fortalecer. Son tambin los mismos msculos que utiliza cuando trata de evitar los gases. Puede practicar apretando estos msculos 10 veces, y repetir esto 3 veces por da aproximadamente. Una vez que conozca qu msculos debe apretar, no realice estos ejercicios durante la miccin. Puede favorecerle una infeccin si la orina vuelve hacia atrs.   Pida ayuda si tiene necesidades econmicas, de asesoramiento o nutricionales durante el embarazo. El profesional podr ayudarla con respecto a estas necesidades, o derivarla a otros especialistas.   La piel puede ponerse grasa. Si esto sucede, lvese la cara con un jabn suave, utilice un humectante no  graso y maquillaje con base de aceite o crema.  CONSUMO DE MEDICAMENTOS Y DROGAS DURANTE EL EMBARAZO  Contine tomando las vitaminas apropiadas para esta etapa tal como se le indic. Las vitaminas deben contener un miligramo de cido flico y deben suplementarse con hierro. Guarde todas las vitaminas fuera del alcance de los nios. La ingestin de slo un par de vitaminas o tabletas que contengan hierro puede ocasionar la muerte en un beb o en un nio pequeo.   Evite el uso de medicamentos, inclusive los de venta libre y hierbas que no hayan sido prescriptos o indicados por el profesional que la asiste. Algunos medicamentos pueden causar problemas fsicos al beb. Utilice los medicamentos de venta libre o de prescripcin para el dolor, el malestar o la fiebre, segn se lo indique el profesional que lo asiste. No utilice aspirina.   El consumo de alcohol est relacionado con ciertos defectos de nacimiento. Esto incluye el sndrome de alcoholismo fetal. Debe evitar el consumo de alcohol en cualquiera de sus formas. El cigarrillo   causa nacimientos prematuros y bebs de bajo peso. El uso de drogas recreativas est absolutamente prohibido. Son muy nocivas para el beb. Un beb que nace de una madre adicta, ser adicto al nacer. Ese beb tendr los mismos sntomas de abstinencia que un adulto.   Infrmele al profesional si consume alguna droga.   No consuma drogas ilegales. Pueden causarle mucho dao al beb.  SOLICITE ATENCIN MDICA SI: Tiene preguntas o preocupaciones durante su embarazo. Es mejor que llame para consultar las dudas que esperar hasta su prxima visita prenatal. De esta forma se sentir ms tranquila.  SOLICITE ATENCIN MDICA DE INMEDIATO SI:  La temperatura oral se eleva sin motivo por encima de 102 F (38.9 C) o segn le indique el profesional que lo asiste.   Tiene una prdida de lquido por la vagina (canal de parto). Si sospecha una ruptura de las membranas, tmese la  temperatura y llame al profesional para informarlo sobre esto.   Observa unas pequeas manchas, una hemorragia vaginal o elimina cogulos. Notifique al profesional acerca de la cantidad y de cuntos apsitos est utilizando. Unas pequeas manchas de sangre son algo comn durante el embarazo, especialmente despus de mantener relaciones sexuales.   Presenta un olor desagradable en la secrecin vaginal y observa un cambio en el color, de transparente a blanco.   Contina con las nuseas y no obtiene alivio de los remedios indicados. Vomita sangre o algo similar a la borra del caf.   [place] o sube ms de 900 g. en una semana, o segn lo indicado por el profesional que la asiste.   Observa que se le hinchan el rostro, las manos, los pies o las piernas.   Ha estado expuesta a la rubola y no ha sufrido la enfermedad.   Ha estado expuesta a la quinta enfermedad o a la varicela.   Presenta dolor abdominal. Las molestias en el ligamento redondo son una causa no cancerosa (benigna) frecuente de dolor abdominal durante el embarazo. El profesional que la asiste deber evaluarla.   Presenta dolor de cabeza intenso que no se alivia.   Presenta fiebre, diarrea, dolor al orinar o le falta la respiracin.   Presenta dificultad para ver, visin borrosa, o visin doble.   Sufre una cada, un accidente de trnsito o cualquier tipo de trauma.   Vive en un hogar en el que existe violencia fsica o mental.  Document Released: 01/23/2005 Document Revised: 04/04/2011 [employer] Patient Information 2012 [employer]. 

## 2011-10-10 LAB — WET PREP, GENITAL
Trich, Wet Prep: NONE SEEN
Yeast Wet Prep HPF POC: NONE SEEN

## 2011-10-10 LAB — RPR

## 2011-10-11 LAB — CULTURE, OB URINE

## 2011-10-16 ENCOUNTER — Ambulatory Visit (HOSPITAL_COMMUNITY)
Admission: RE | Admit: 2011-10-16 | Discharge: 2011-10-16 | Disposition: A | Discharge status: Home / Self Care | Payer: Self-pay | Source: Ambulatory Visit | Attending: Family Medicine | Admitting: Family Medicine

## 2011-10-16 DIAGNOSIS — Z1389 Encounter for screening for other disorder: Secondary | ICD-10-CM | POA: Insufficient documentation

## 2011-10-16 DIAGNOSIS — O358XX0 Maternal care for other (suspected) fetal abnormality and damage, not applicable or unspecified: Secondary | ICD-10-CM | POA: Insufficient documentation

## 2011-10-16 DIAGNOSIS — Z363 Encounter for antenatal screening for malformations: Secondary | ICD-10-CM | POA: Insufficient documentation

## 2011-10-16 DIAGNOSIS — Z3480 Encounter for supervision of other normal pregnancy, unspecified trimester: Secondary | ICD-10-CM

## 2011-10-16 LAB — HEMOGLOBINOPATHY EVALUATION
Hgb A2 Quant: 2.8 % (ref 2.2–3.2)
Hgb A: 97.2 % (ref 96.8–97.8)
Hgb F Quant: 0 % (ref 0.0–2.0)

## 2011-10-22 ENCOUNTER — Encounter: Payer: Self-pay | Admitting: Family Medicine

## 2011-11-06 ENCOUNTER — Ambulatory Visit (INDEPENDENT_AMBULATORY_CARE_PROVIDER_SITE_OTHER): Payer: Self-pay | Admitting: Advanced Practice Midwife

## 2011-11-06 VITALS — BP 96/68 | Temp 97.0°F | Wt 131.1 lb

## 2011-11-06 DIAGNOSIS — A499 Bacterial infection, unspecified: Secondary | ICD-10-CM

## 2011-11-06 DIAGNOSIS — B9689 Other specified bacterial agents as the cause of diseases classified elsewhere: Secondary | ICD-10-CM | POA: Insufficient documentation

## 2011-11-06 DIAGNOSIS — N760 Acute vaginitis: Secondary | ICD-10-CM

## 2011-11-06 DIAGNOSIS — O0930 Supervision of pregnancy with insufficient antenatal care, unspecified trimester: Secondary | ICD-10-CM

## 2011-11-06 DIAGNOSIS — Z3480 Encounter for supervision of other normal pregnancy, unspecified trimester: Secondary | ICD-10-CM

## 2011-11-06 LAB — POCT URINALYSIS DIP (DEVICE)
Bilirubin Urine: NEGATIVE
Glucose, UA: NEGATIVE mg/dL
Hgb urine dipstick: NEGATIVE
Leukocytes, UA: NEGATIVE
Nitrite: NEGATIVE
Nitrite: NEGATIVE
Protein, ur: NEGATIVE mg/dL
Urobilinogen, UA: 0.2 mg/dL (ref 0.0–1.0)
Urobilinogen, UA: 0.2 mg/dL (ref 0.0–1.0)
pH: 6 (ref 5.0–8.0)

## 2011-11-06 MED ORDER — METRONIDAZOLE 500 MG PO TABS: 2000.0000 mg | ORAL_TABLET | Freq: Once | ORAL | Status: AC

## 2011-11-06 NOTE — Patient Instructions (Addendum)
Embarazo - Segundo trimestre (Pregnancy - Second Trimester) El segundo trimestre del embarazo (del 3 al 6mes) es un perodo de evolucin rpida para usted y el beb. Hacia el final del sexto mes, el beb mide aproximadamente 23 cm y pesa 680 g. Comenzar a sentir los movimientos del beb entre las 18 y las 20 semanas de embarazo. Podr sentir las pataditas ("quickening en ingls"). Hay un rpido aumento de peso. Puede segregar un lquido claro (calostro) de las mamas. Quizs sienta pequeas contracciones en el vientre (tero) Esto se conoce como falso trabajo de parto o contracciones de Braxton-Hicks. Es como una prctica del trabajo de parto que se produce cuando el beb est listo para salir. Generalmente los problemas de vmitos matinales ya se han superado hacia el final del primer trimestre. Algunas mujeres desarrollan pequeas manchas oscuras (que se denominan cloasma, mscara del embarazo) en la cara que normalmente se van luego del nacimiento del beb. La exposicin al sol empeora las manchas. Puede desarrollarse acn en algunas mujeres embarazadas, y puede desaparecer en aquellas que ya tienen acn. EXAMENES PRENATALES  Durante los exmenes prenatales, deber seguir realizando pruebas de sangre, segn avance el embarazo. Estas pruebas se realizan para controlar su salud y la del beb. Tambin se realizan anlisis de sangre para conocer los niveles de hemoglobina. La anemia (bajo nivel de hemoglobina) es frecuente durante el embarazo. Para prevenirla, se administran hierro y vitaminas. Tambin se le realizarn exmenes para saber si tiene diabetes entre las 24 y las 28 semanas del embarazo. Podrn repetirle algunas de las pruebas que le hicieron previamente.   En cada visita le medirn el tamao del tero. Esto se realiza para asegurarse de que el beb est creciendo correctamente de acuerdo al estado del embarazo.   Tambin en cada visita prenatal controlarn su presin arterial. Esto se realiza  para asegurarse de que no tenga toxemia.   Se controlar su orina para asegurarse de que no tenga infecciones, diabetes o protena en la orina.   Se controlar su peso regularmente para asegurarse que el aumento ocurre al ritmo indicado. Esto se hace para asegurarse que usted y el beb tienen una evolucin normal.   En algunas ocasiones se realiza una prueba de ultrasonido para confirmar el correcto desarrollo y evolucin del beb. Esta prueba se realiza con ondas sonoras inofensivas para el beb, de modo que el profesional pueda calcular ms precisamente la fecha del parto.  Algunas veces se realizan pruebas especializadas del lquido amnitico que rodea al beb. Esta prueba se denomina amniocentesis. El lquido amnitico se obtiene introduciendo una aguja en el vientre (abdomen). Se realiza para controlar los cromosomas en aquellos casos en los que existe alguna preocupacin acerca de algn problema gentico que pueda sufrir el beb. En ocasiones se lleva a cabo cerca del final del embarazo, si es necesario inducir al parto. En este caso se realiza para asegurarse que los pulmones del beb estn lo suficientemente maduros como para que pueda vivir fuera del tero. CAMBIOS QUE OCURREN EN EL SEGUNDO TRIMESTRE DEL EMBARAZO Su organismo atravesar numerosos cambios durante el embarazo. Estos pueden variar de una persona a otra. Converse con el profesional que la asiste acerca los cambios que usted note y que la preocupen.  Durante el segundo trimestre probablemente sienta un aumento del apetito. Es normal tener "antojos" de ciertas comidas. Esto vara de una persona a otra y de un embarazo a otro.   El abdomen inferior comenzar a abultarse.   Podr tener la necesidad   de orinar con ms frecuencia debido a que el tero y el beb presionan sobre la vejiga. Tambin es frecuente contraer ms infecciones urinarias durante el embarazo (dolor al orinar). Puede evitarlas bebiendo gran cantidad de lquidos y  vaciando la vejiga antes y despus de mantener relaciones sexuales.   Podrn aparecer las primeras estras en las caderas, abdomen y mamas. Estos son cambios normales del cuerpo durante el embarazo. No existen medicamentos ni ejercicios que puedan prevenir estos cambios.   Es posible que comience a desarrollar venas inflamadas y abultadas (varices) en las piernas. El uso de medias de descanso, elevar sus pies durante 15 minutos, 3 a 4 veces al da y limitar la sal en su dieta ayuda a aliviar el problema.   Podr sentir acidez gstrica a medida que el tero crece y presiona contra el estmago. Puede tomar anticidos, con la autorizacin de su mdico, para aliviar este problema. Tambin es til ingerir pequeas comidas 4 a 5 veces al da.   La constipacin puede tratarse con un laxante o agregando fibra a su dieta. Beber grandes cantidades de lquidos, comer vegetales, frutas y granos integrales es de gran ayuda.   Tambin es beneficioso practicar actividad fsica. Si ha sido una persona activa hasta el embarazo, podr continuar con la mayora de las actividades durante el mismo. Si ha sido menos activa, puede ser beneficioso que comience con un programa de ejercicios, como realizar caminatas.   Puede desarrollar hemorroides (vrices en el recto) hacia el final del segundo trimestre. Tomar baos de asiento tibios y utilizar cremas recomendadas por el profesional que lo asiste sern de ayuda para los problemas de hemorroides.   Tambin podr sentir dolor de espalda durante este momento de su embarazo. Evite levantar objetos pesados, utilice zapatos de taco bajo y mantenga una buena postura para ayudar a reducir los problemas de espalda.   Algunas mujeres embarazadas desarrollan hormigueo y adormecimiento de la mano y los dedos debido a la hinchazn y compresin de los ligamentos de la mueca (sndrome del tnel carpiano). Esto desaparece una vez que el beb nace.   Como sus pechos se agrandan,  necesitar un sujetador ms grande. Use un sostn de soporte, cmodo y de algodn. No utilice un sostn para amamantar hasta el ltimo mes de embarazo si va a amamantar al beb.   Podr observar una lnea oscura desde el ombligo hacia la zona pbica denominada linea nigra.   Podr observar que sus mejillas se ponen coloradas debido al aumento de flujo sanguneo en la cara.   Podr desarrollar "araitas" en la cara, cuello y pecho. Esto desaparece una vez que el beb nace.  INSTRUCCIONES PARA EL CUIDADO DOMICILIARIO  Es extremadamente importante que evite el cigarrillo, hierbas medicinales, alcohol y las drogas no prescriptas durante el embarazo. Estas sustancias qumicas afectan la formacin y el desarrollo del beb. Evite estas sustancias durante todo el embarazo para asegurar el nacimiento de un beb sano.   La mayor parte de los cuidados que se aconsejan son los mismos que los indicados para el primer trimestre del embarazo. Cumpla con las citas tal como se le indic. Siga las instrucciones del profesional que lo asiste con respecto al uso de los medicamentos, el ejercicio y la dieta.   Durante el embarazo debe obtener nutrientes para usted y para su beb. Consuma alimentos balanceados a intervalos regulares. Elija alimentos como carne, pescado, leche y otros productos lcteos descremados, vegetales, frutas, panes integrales y cereales. El profesional le informar cul es el   aumento de peso ideal.   Las relaciones sexuales fsicas pueden continuarse hasta cerca del fin del embarazo si no existen otros problemas. Estos problemas pueden ser la prdida temprana (prematura) de lquido amnitico de las membranas, sangrado vaginal, dolor abdominal u otros problemas mdicos o del embarazo.   Realice actividad fsica todos los das, si no tiene restricciones. Consulte con el profesional que la asiste si no sabe con certeza si determinados ejercicios son seguros. El mayor aumento de peso tiene lugar  durante los ltimos 2 trimestres del embarazo. El ejercicio la ayudar a:   Controlar su peso.   Ponerla en forma para el parto.   Ayudarla a perder peso luego de haber dado a luz.   Use un buen sostn o como los que se usan para hacer deportes para aliviar la sensibilidad de las mamas. Tambin puede serle til si lo usa mientras duerme. Si pierde calostro, podr utilizar apsitos en el sostn.   No utilice la baera con agua caliente, baos turcos y saunas durante el embarazo.   Utilice el cinturn de seguridad sin excepcin cuando conduzca. Este la proteger a usted y al beb en caso de accidente.   Evite comer carne cruda, queso crudo, y el contacto con los utensilios y desperdicios de los gatos. Estos elementos contienen grmenes que pueden causar defectos de nacimiento en el beb.   El segundo trimestre es un buen momento para visitar a su dentista y evaluar su salud dental si an no lo ha hecho. Es importante mantener los dientes limpios. Utilice un cepillo de dientes blando. Cepllese ms suavemente durante el embarazo.   Es ms fcil perder algo de orina durante el embarazo. Apretar y fortalecer los msculos de la pelvis la ayudar con este problema. Practique detener la miccin cuando est en el bao. Estos son los mismos msculos que necesita fortalecer. Son tambin los mismos msculos que utiliza cuando trata de evitar los gases. Puede practicar apretando estos msculos 10 veces, y repetir esto 3 veces por da aproximadamente. Una vez que conozca qu msculos debe apretar, no realice estos ejercicios durante la miccin. Puede favorecerle una infeccin si la orina vuelve hacia atrs.   Pida ayuda si tiene necesidades econmicas, de asesoramiento o nutricionales durante el embarazo. El profesional podr ayudarla con respecto a estas necesidades, o derivarla a otros especialistas.   La piel puede ponerse grasa. Si esto sucede, lvese la cara con un jabn suave, utilice un humectante no  graso y maquillaje con base de aceite o crema.  CONSUMO DE MEDICAMENTOS Y DROGAS DURANTE EL EMBARAZO  Contine tomando las vitaminas apropiadas para esta etapa tal como se le indic. Las vitaminas deben contener un miligramo de cido flico y deben suplementarse con hierro. Guarde todas las vitaminas fuera del alcance de los nios. La ingestin de slo un par de vitaminas o tabletas que contengan hierro puede ocasionar la muerte en un beb o en un nio pequeo.   Evite el uso de medicamentos, inclusive los de venta libre y hierbas que no hayan sido prescriptos o indicados por el profesional que la asiste. Algunos medicamentos pueden causar problemas fsicos al beb. Utilice los medicamentos de venta libre o de prescripcin para el dolor, el malestar o la fiebre, segn se lo indique el profesional que lo asiste. No utilice aspirina.   El consumo de alcohol est relacionado con ciertos defectos de nacimiento. Esto incluye el sndrome de alcoholismo fetal. Debe evitar el consumo de alcohol en cualquiera de sus formas. El cigarrillo   causa nacimientos prematuros y bebs de bajo peso. El uso de drogas recreativas est absolutamente prohibido. Son muy nocivas para el beb. Un beb que nace de una madre adicta, ser adicto al nacer. Ese beb tendr los mismos sntomas de abstinencia que un adulto.   Infrmele al profesional si consume alguna droga.   No consuma drogas ilegales. Pueden causarle mucho dao al beb.  SOLICITE ATENCIN MDICA SI: Tiene preguntas o preocupaciones durante su embarazo. Es mejor que llame para consultar las dudas que esperar hasta su prxima visita prenatal. De esta forma se sentir ms tranquila.  SOLICITE ATENCIN MDICA DE INMEDIATO SI:  La temperatura oral se eleva sin motivo por encima de 102 F (38.9 C) o segn le indique el profesional que lo asiste.   Tiene una prdida de lquido por la vagina (canal de parto). Si sospecha una ruptura de las membranas, tmese la  temperatura y llame al profesional para informarlo sobre esto.   Observa unas pequeas manchas, una hemorragia vaginal o elimina cogulos. Notifique al profesional acerca de la cantidad y de cuntos apsitos est utilizando. Unas pequeas manchas de sangre son algo comn durante el embarazo, especialmente despus de mantener relaciones sexuales.   Presenta un olor desagradable en la secrecin vaginal y observa un cambio en el color, de transparente a blanco.   Contina con las nuseas y no obtiene alivio de los remedios indicados. Vomita sangre o algo similar a la borra del caf.   [place] o sube ms de 900 g. en una semana, o segn lo indicado por el profesional que la asiste.   Observa que se le hinchan el rostro, las manos, los pies o las piernas.   Ha estado expuesta a la rubola y no ha sufrido la enfermedad.   Ha estado expuesta a la quinta enfermedad o a la varicela.   Presenta dolor abdominal. Las molestias en el ligamento redondo son una causa no cancerosa (benigna) frecuente de dolor abdominal durante el embarazo. El profesional que la asiste deber evaluarla.   Presenta dolor de cabeza intenso que no se alivia.   Presenta fiebre, diarrea, dolor al orinar o le falta la respiracin.   Presenta dificultad para ver, visin borrosa, o visin doble.   Sufre una cada, un accidente de trnsito o cualquier tipo de trauma.   Vive en un hogar en el que existe violencia fsica o mental.  Document Released: 01/23/2005 Document Revised: 04/04/2011 [employer] Patient Information 2012 [employer]. 

## 2011-11-06 NOTE — Progress Notes (Signed)
No questions. No pain, no spotting or bleeding, no fluid leaking, no contractions, does feel baby moving.Marland Kitchen Has noticed + discharge and fishy odor, had few clue cells on wet prep last time. Will treat with 2g of flagyl PO. Advised pt not to drink alcohol while pregnant or taking flagyl. After delivery, plans to use paraguard IUD. Does not plan to have future children. Is also interested in tubal ligation and would like financial information as she is self pay. Plans to breast and bottle feed. Does not plan to have baby circumcised.

## 2011-11-06 NOTE — Progress Notes (Signed)
Agree with note, seen also by me.

## 2011-11-06 NOTE — Progress Notes (Signed)
Pulse 98 Needs 1 hr at 1003

## 2011-11-20 ENCOUNTER — Inpatient Hospital Stay (HOSPITAL_COMMUNITY)
Admission: AD | Admit: 2011-11-20 | Discharge: 2011-11-20 | Disposition: A | Discharge status: Home / Self Care | Payer: Self-pay | Source: Ambulatory Visit | Attending: Obstetrics & Gynecology | Admitting: Obstetrics & Gynecology

## 2011-11-20 ENCOUNTER — Encounter: Payer: Self-pay | Admitting: Advanced Practice Midwife

## 2011-11-20 ENCOUNTER — Encounter (HOSPITAL_COMMUNITY): Payer: Self-pay

## 2011-11-20 ENCOUNTER — Ambulatory Visit (INDEPENDENT_AMBULATORY_CARE_PROVIDER_SITE_OTHER): Payer: Self-pay | Admitting: Advanced Practice Midwife

## 2011-11-20 VITALS — BP 104/66 | Temp 98.1°F | Wt 134.8 lb

## 2011-11-20 DIAGNOSIS — R0602 Shortness of breath: Secondary | ICD-10-CM | POA: Insufficient documentation

## 2011-11-20 DIAGNOSIS — N760 Acute vaginitis: Secondary | ICD-10-CM

## 2011-11-20 DIAGNOSIS — R0600 Dyspnea, unspecified: Secondary | ICD-10-CM

## 2011-11-20 DIAGNOSIS — O99340 Other mental disorders complicating pregnancy, unspecified trimester: Secondary | ICD-10-CM

## 2011-11-20 DIAGNOSIS — F32A Depression, unspecified: Secondary | ICD-10-CM | POA: Insufficient documentation

## 2011-11-20 DIAGNOSIS — O99891 Other specified diseases and conditions complicating pregnancy: Secondary | ICD-10-CM | POA: Insufficient documentation

## 2011-11-20 DIAGNOSIS — F329 Major depressive disorder, single episode, unspecified: Secondary | ICD-10-CM

## 2011-11-20 DIAGNOSIS — Z3480 Encounter for supervision of other normal pregnancy, unspecified trimester: Secondary | ICD-10-CM

## 2011-11-20 DIAGNOSIS — R079 Chest pain, unspecified: Secondary | ICD-10-CM | POA: Insufficient documentation

## 2011-11-20 DIAGNOSIS — R0609 Other forms of dyspnea: Secondary | ICD-10-CM

## 2011-11-20 DIAGNOSIS — A499 Bacterial infection, unspecified: Secondary | ICD-10-CM

## 2011-11-20 HISTORY — DX: Anxiety disorder, unspecified: F41.9

## 2011-11-20 HISTORY — DX: Major depressive disorder, single episode, unspecified: F32.9

## 2011-11-20 LAB — URINALYSIS, ROUTINE W REFLEX MICROSCOPIC
Bilirubin Urine: NEGATIVE
Ketones, ur: NEGATIVE mg/dL
Nitrite: NEGATIVE
Protein, ur: NEGATIVE mg/dL
Urobilinogen, UA: 0.2 mg/dL (ref 0.0–1.0)

## 2011-11-20 LAB — POCT URINALYSIS DIP (DEVICE)
Bilirubin Urine: NEGATIVE
Nitrite: NEGATIVE
Protein, ur: NEGATIVE mg/dL
Urobilinogen, UA: 0.2 mg/dL (ref 0.0–1.0)
pH: 6.5 (ref 5.0–8.0)

## 2011-11-20 MED ORDER — METRONIDAZOLE 0.75 % VA GEL: 1.0000 | Freq: Two times a day (BID) | VAGINAL | Status: AC

## 2011-11-20 NOTE — Patient Instructions (Signed)
Embarazo - Tercer trimestre (Pregnancy - Third Trimester) El tercer trimestre del embarazo (los ltimos 3 meses) es el perodo de cambios ms rpidos que atraviesan usted y el beb. El aumento de peso es ms rpido. El beb alcanza un largo de aproximadamente 50 cm (20 pulgadas) y pesa entre 2,700 y 4,500 kg (6 a 10 libras). El beb gana ms tejido graso y ya est listo para la vida fuera del cuerpo de la madre. Mientras estn en el interior, los bebs tienen perodos de sueo y vigilia, succionan el pulgar y tienen hipo. Quizs sienta pequeas contracciones del tero. Este es el falso trabajo de parto. Tambin se las conoce como contracciones de Braxton-Hicks. Es como una prctica del parto. Los problemas ms habituales de esta etapa del embarazo incluyen mayor dificultad para respirar, hinchazn de las manos y los pies por retencin de lquidos y la necesidad de orinar con ms frecuencia debido a que el tero y el beb presionan sobre la vejiga.  EXAMENES PRENATALES  Durante los exmenes prenatales, deber seguir realizando pruebas de sangre, segn avance el embarazo. Estas pruebas se realizan para controlar su salud y la del beb. Tambin se realizan anlisis de sangre para conocer los niveles de hemoglobina. La anemia (bajo nivel de hemoglobina) es frecuente durante el embarazo. Para prevenirla, se administran hierro y vitaminas. Tambin le harn nuevas pruebas para descartar la diabetes. Podrn repetirle algunas de las pruebas que le hicieron previamente.   En cada visita le medirn el tamao del tero. Es para asegurarse de que el beb se desarrolla correctamente.   Tambin en cada visita la pesarn. Esto se realiza para asegurarse de que aumenta de peso al ritmo indicado y que usted y su beb evolucionan normalmente.   En algunas ocasiones se realiza una ecografa para confirmar el correcto desarrollo y evolucin del beb. Esta prueba se realiza con ondas sonoras inofensivas para el beb, de modo  que el profesional pueda calcular con ms precisin la fecha del parto.   Discuta las posibilidades de la anestesia si necesita cesrea.  Algunas veces se realizan pruebas especializadas del lquido amnitico que rodea al beb. Esta prueba se denomina amniocentesis. El lquido amnitico se obtiene introduciendo una aguja en el abdomen (vientre). En ocasiones se lleva a cabo cerca del final del embarazo, si es necesario adelantar el parto. En este caso se realiza para asegurarse de que los pulmones del beb estn lo suficientemente maduros como para que pueda vivir fuera del tero. CAMBIOS QUE OCURREN EN EL TERCER TRIMESTRE DEL EMBARAZO Su organismo atravesar diferentes cambios durante el embarazo que varan de una persona a otra. Converse con el profesional que la asiste acerca los cambios que usted note y que la preocupen.  Durante el ltimo trimestre probablemente sienta un aumento del apetito. Es normal tener "antojos" de ciertas comidas. Esto vara de una persona a otra y de un embarazo a otro.   Podrn aparecer las primeras estras en las caderas, abdomen y mamas. Estos son cambios normales del cuerpo durante el embarazo. No existen medicamentos ni ejercicios que puedan prevenir estos cambios.   El estreimiento puede tratarse con un laxante o agregando fibra a su dieta. Beber grandes cantidades de lquidos, tomar fibras en forma de verduras, frutas y granos integrales es de gran ayuda.   Tambin es beneficioso practicar actividad fsica. Si ha sido una persona activa hasta el embarazo, podr continuar con la mayora de las actividades durante el mismo. Si ha sido menos activa, puede ser beneficioso   que comience con un programa de ejercicios, como realizar caminatas. Consulte con el profesional que la asiste antes de comenzar un programa de ejercicios.   Evite el consumo de cigarrillos, el alcohol, los medicamentos no prescritos y las "drogas de la calle" durante el embarazo. Estas sustancias  qumicas afectan la formacin y el desarrollo del beb. Evite estas sustancias durante todo el embarazo para asegurar el nacimiento de un beb sano.   Dolor de espalda, venas varicosas y hemorroides podran aparecer o empeorar.   Los movimientos del beb pueden ser ms bruscos y aparecer ms a menudo.   Puede que note dificultades para respirar facilmente.   El ombligo podra salrsele hacia afuera.   Puede segregar un lquido amarillento (calostro) de las mamas.   Puede segregar mucus con sangre. Esto normalmente ocurre unos pocos das a una semana antes de que comience el trabajo de parto.  INSTRUCCIONES PARA EL CUIDADO DOMICILIARIO  La mayor parte de los cuidados que se aconsejan son los mismos que los indicados para las primeras etapas del embarazo. Es importante que concurra a todas las citas con el profesional y siga sus instrucciones con respecto a los medicamentos que deba utilizar, a la actividad fsica y a la dieta.   Durante el embarazo debe obtener nutrientes para usted y para su beb. Consuma alimentos balanceados a intervalos regulares. Elija alimentos como carne, pescado, leche y otros productos lcteos descremados, verduras, frutas, panes integrales y cereales. El profesional le informar cul es el aumento de peso ideal.   Las relaciones sexuales pueden continuarse hasta casi el final del embarazo, si no se presentan otros problemas como prdida prematura (antes de tiempo) de lquido amnitico, hemorragia vaginal o dolor abdominal (en el vientre).   Realice actividad fsica todos los das, si no tiene restricciones. Consulte con el profesional que la asiste si no sabe con certeza si determinados ejercicios son seguros. El mayor aumento de peso se produce en los dos ltimos trimestres del embarazo.   Haga reposo con frecuencia, con las piernas elevadas, o segn lo necesite para evitar los calambres y el dolor de cintura.   Use un buen sostn o como los que se usan para hacer  deportes para aliviar la sensibilidad de las mamas. Tambin puede serle til si lo usa mientras duerme. Si pierde calostro, podr utilizar apsitos en el sostn.   No utilice la baera con agua caliente, baos turcos y saunas.   Colquese el cinturn de seguridad cuando conduzca. Este la proteger a usted y al beb en caso de accidente.   Evite comer carne cruda y el contacto con los utensilios y desperdicios de los gatos. Estos elementos contienen grmenes que pueden causar defectos de nacimiento en el beb.   Es fcil perder algo de orina durante el embarazo. Apretar y fortalecer los msculos de la pelvis la ayudar con este problema. Practique detener la miccin cuando est en el bao. Estos son los mismos msculos que necesita fortalecer. Son tambin los mismos msculos que utiliza cuando trata de evitar los gases. Puede practicar apretando estos msculos diez veces, y repetir esto tres veces por da aproximadamente. Una vez que conozca qu msculos debe contraer, no realice estos ejercicios durante la miccin. Puede favorecerle una infeccin si la orina vuelve hacia atrs.   Pida ayuda si tiene necesidades econmicas, de asesoramiento o nutricionales durante el embarazo. El profesional podr ayudarla con respecto a estas necesidades, o derivarla a otros especialistas.   Practique la ida hasta el hospital a modo   de prueba.   Tome clases prenatales junto con su pareja para comprender, practicar y hacer preguntas acerca del trabajo de parto y el nacimiento.   Prepare la habitacin del beb.   No viaje fuera de la ciudad a menos que sea absolutamente necesario y con el consejo del mdico.   Use slo zapatos bajos sin taco para tener un mejor equilibrio y prevenir cadas.  EL CONSUMO DE MEDICAMENTOS Y DROGAS DURANTE EL EMBARAZO  Contine tomando las vitaminas apropiadas para esta etapa tal como se le indic. Las vitaminas deben contener un miligramo de cido flico y deben suplementarse con  hierro. Guarde todas las vitaminas fuera del alcance de los nios. La ingestin de slo un par de vitaminas o comprimidos que contengan hierro pueden ocasionar la muerte en un beb o en un nio pequeo.   Evite el uso de medicamentos, inclusive los de venta libre, que no hayan sido prescritos o indicados por el profesional que la asiste. Algunos medicamentos pueden causar problemas fsicos al beb. Utilice los medicamentos de venta libre o de prescripcin para el dolor, el malestar o la fiebre, segn se lo indique el profesional que lo asiste. No utilice aspirina, ibuprofeno (Motrin, Advil, Nuprin) o naproxeno (Aleve) a menos que el profesional la autorice.   El alcohol se asocia a cierto nmero de defectos del nacimiento, incluido el sndrome de alcoholismo fetal. Debe evitar el consumo de alcohol en cualquiera de sus formas. El cigarrillo causa nacimientos prematuros y bebs de bajo peso al nacer. Las drogas de la calle son muy nocivas para el beb y estn absolutamente prohibidas. Un beb que nace de una madre adicta, ser adicto al nacer. Ese beb tendr los mismos sntomas de abstinencia que un adulto.   Infrmele al profesional si consume alguna droga.  SOLICITE ATENCIN MDICA SI: Tiene alguna preocupacin durante el embarazo. Es mejor que llame para formular las preguntas si no puede esperar hasta la prxima visita, que sentirse preocupada por ellas.  DECISIONES ACERCA DE LA CIRCUNCISIN Usted puede saber o no cul es el sexo de su beb. Si es un varn, ste es el momento de pensar acerca de la circuncisin. La circuncisin es la extirpacin del prepucio. Esta es la piel que cubre el extremo sensible del pene. No hay un motivo mdico que lo justifique. Generalmente la decisin se toma segn lo que sea popular en ese momento, o se basa en creencias religiosas. Podr conversar estos temas con el profesional que la asiste. SOLICITE ATENCIN MDICA DE INMEDIATO SI:  La temperatura oral se eleva  sin motivo por encima de 102 F (38.9 C) o segn le indique el profesional que la asiste.   Tiene una prdida de lquido por la vagina (canal de parto). Si sospecha una ruptura de las membranas, tmese la temperatura y llame al profesional para informarlo sobre esto.   Observa unas pequeas manchas, una hemorragia vaginal o elimina cogulos. Avsele al profesional acerca de la cantidad y de cuntos apsitos est utilizando.   Presenta un olor desagradable en la secrecin vaginal y observa un cambio en el color, de transparente a blanco.   Ha vomitado durante ms de 24 horas.   Presenta escalofros o fiebre.   Comienza a sentir falta de aire.   Siente ardor al orinar.   Baja o sube ms de 900 g (ms de 2 libras), o segn lo indicado por el profesional que la asiste. Observa que sbitamente se le hinchan el rostro, las manos, los pies o las   piernas.   Presenta dolor abdominal. Las molestias en el ligamento redondo son una causa benigna (no cancerosa) frecuente de dolor abdominal durante el embarazo, pero el profesional que la asiste deber evaluarlo.   Presenta dolor de cabeza intenso que no se alivia.   Si no siente los movimientos del beb durante ms de tres horas. Si piensa que el beb no se mueve tanto como lo haca habitualmente, coma algo que contenga azcar y recustese sobre el lado izquierdo durante una hora. El beb debe moverse al menos 4  5 veces por hora. Comunquese inmediatamente si el beb se mueve menos que lo indicado.   Se cae, se ve involucrada en un accidente automovilstico o sufre algn tipo de traumatismo.   En su hogar hay violencia mental o fsica.  Document Released: 01/23/2005 Document Revised: 04/04/2011 [employer] Patient Information 2012 [employer]. 

## 2011-11-20 NOTE — MAU Provider Note (Signed)
History   CSN: [id number]  Arrival date and time: 11/20/11 1036  First Provider Initiated Contact with Patient 11/20/11 1121  Chief Complaint  Patient presents with  . Shortness of Breath   HPI: Ms. Connie Boyd is a 26 year old [id number] at 25 weeks + 5 days gestation who [facility] for dyspnea and chest tightness.  She reports that these symptoms started last night around 2200.  She was just sitting on the couch when she started having the sensation that she could not breathe very well and had to take quick, deep breaths to "keep up."  She lay on her side and felt somewhat better.  She still has this sensation today, but it is improved.  It is fairly constant but does come and go at times.  She cannot really quantify or qualify the sensation much more than that.  She does deny worsening with inspiration.  The sensation is no worse when she eats and is not associated with nausea, vomiting, abdominal pain, palpitations, or diarrhea.  She did not take any medications or do much of anything to try to relieve herself.  She does not smoke and has no history of asthma or heart conditions that she knows of.  She does have a history of anxiety and felt very depressed and sad yesterday ("like I wanted to cry a lot") for no identifiable reason.  She denies suicidal ideation and is overall excited about this pregnancy.  She reports depression in the past but has never sought treatment.  She reports that this summer has been a lot more stressful with her 75-year-old son and 47-year-old daughter both in the house and having to care for them with the prospect of a new baby on the way.  OB History    Grav Para Term Preterm Abortions TAB SAB Ect Mult Living   4 2 2  1  1   2       Past Medical History  Diagnosis Date  . Anxiety   . Depression     Past Surgical History  Procedure Date  . Appendectomy   . Ovarian cyst removal     Family History  Problem Relation  Age of Onset  . Anesthesia problems Neg Hx   . Other Neg Hx   . Cancer Paternal Uncle   . Diabetes Maternal Grandfather     History  Substance Use Topics  . Smoking status: Never Smoker   . Smokeless tobacco: Never Used  . Alcohol Use: 0.6 oz/week    1 Cans of beer per week  (EtOH use was prior to pregnancy)  Allergies: No Known Allergies  Prescriptions prior to admission  Medication Sig Dispense Refill  . Prenatal Vit-Fe Fumarate-FA (PRENATAL MULTIVITAMIN) TABS Take 1 tablet by mouth daily.        Review of Systems  Constitutional: Negative for fever, chills, malaise/fatigue and diaphoresis.  Eyes: Negative for blurred vision.  Respiratory: Positive for shortness of breath. Negative for cough, hemoptysis, sputum production and wheezing.   Cardiovascular: Positive for chest pain (Tightness with SOB). Negative for palpitations, orthopnea, leg swelling and PND.  Gastrointestinal: Negative for vomiting, diarrhea and constipation.  Genitourinary: Negative for dysuria, urgency and frequency.  Skin: Negative for itching and rash.  Neurological: Negative for dizziness, weakness and headaches.  Psychiatric/Behavioral: Positive for depression. Negative for suicidal ideas, hallucinations and substance abuse. The patient is nervous/anxious. The patient does not have insomnia.    Physical Exam   Temp:  [  98.1 F (36.7 C)-98.9 F (37.2 C)] 98.9 F (37.2 C) (07/24 1047) Pulse Rate:  [78] 78  (07/24 1230) Resp:  [16-18] 18  (07/24 1230) BP: (99-105)/(58-66) 99/59 mmHg (07/24 1230) SpO2:  [100 %] 100 % (07/24 1047) Weight:  [61.145 kg (134 lb 12.8 oz)-61.417 kg (135 lb 6.4 oz)] 61.417 kg (135 lb 6.4 oz) (07/24 1047)   Physical Exam  Constitutional: She is oriented to person, place, and time. She appears well-developed and well-nourished. No distress.  HENT:  Head: Normocephalic and atraumatic.  Eyes: Conjunctivae are normal.  Neck: Normal range of motion. Neck supple.    Cardiovascular: Normal rate, regular rhythm, normal heart sounds and intact distal pulses.   No murmur heard. Respiratory: Effort normal and breath sounds normal. No respiratory distress. She has no wheezes. She has no rales. She exhibits no tenderness.  GI: Soft. Bowel sounds are normal. She exhibits no distension. There is no tenderness.  Musculoskeletal: Normal range of motion.  Neurological: She is alert and oriented to person, place, and time.  Skin: Skin is warm and dry. She is not diaphoretic.  Psychiatric: She has a normal mood and affect. Her behavior is normal. Judgment and thought content normal.    MAU Course  Procedures  HPI, SW consult, and other evaluation were primarily performed with an established hospital translator present Assessment and Plan  1. SOB/chest tightness: No sign or symptom of cardiopulmonary disease, such as PE, MI, or PNA.  She is not tachypneic, tachycardic, or hypoxemic.  She appeared comfortable on our exam.  She does endorse quite a bit of stress/anxiety/worry, and the most likely cause of her symptoms is anxiety.  We did have the social worker come discuss treatment options such as psychotherapy, etc. and she was considering pursuing some of those options.  She was reassured by the end of the visit and was ready to be discharged home with close f/u in the clinic.  Pt seen with Sharen Counter, CNM  Junious Silk S 11/20/2011, 12:26 PM   I have seen this patient and agree with the above resident's note.  LEFTWICH-KIRBY, Cary Lothrop Certified Nurse-Midwife

## 2011-11-20 NOTE — Progress Notes (Signed)
Feels short of breath since yesterday. No chest pain. Feels pressure from diaphragm upward. Occasional contractions. Lungs clear and HR regular. No history of asthma or bronchitis. No nasal congestion or fever Will send to MAU for respiratory eval. Also expresses feelings of depression. Denies SI/HI. Also c/o persistent vaginal discharge with odor. May need to try Clinda Cream.

## 2011-11-20 NOTE — MAU Note (Signed)
Patient states she started having a little shortness of breath last night. Was seen in the clinic today and sent to MAU for evaluation. Patient denies any contractions, bleeding or leaking and reports good fetal movement. Respirations in MAU regular and even with O2 sat of 100%.

## 2011-11-20 NOTE — MAU Note (Signed)
Pt states via [place], resident, that she has had intermittent crying spells, also has hx anxiety, denies panic attacks. Did note shortness of breath last pm, chest felt like a tightening. No dyspnea present

## 2011-11-20 NOTE — Progress Notes (Signed)
P= 77 C/o pain and pressure in lower pelvic area

## 2011-11-28 NOTE — MAU Provider Note (Signed)
Attestation of Attending Supervision of Advanced Practitioner (CNM/NP): Evaluation and management procedures were performed by the Advanced Practitioner under my supervision and collaboration.  I have reviewed the Advanced Practitioner's note and chart, and I agree with the management and plan.  UGONNA ANYANWU, M.D.  

## 2011-12-04 ENCOUNTER — Ambulatory Visit (INDEPENDENT_AMBULATORY_CARE_PROVIDER_SITE_OTHER): Payer: Self-pay | Admitting: Obstetrics and Gynecology

## 2011-12-04 ENCOUNTER — Encounter: Payer: Self-pay | Admitting: Obstetrics and Gynecology

## 2011-12-04 DIAGNOSIS — O0930 Supervision of pregnancy with insufficient antenatal care, unspecified trimester: Secondary | ICD-10-CM

## 2011-12-04 LAB — POCT URINALYSIS DIP (DEVICE)
Hgb urine dipstick: NEGATIVE
Ketones, ur: NEGATIVE mg/dL
Protein, ur: NEGATIVE mg/dL
Specific Gravity, Urine: 1.02 (ref 1.005–1.030)
Urobilinogen, UA: 0.2 mg/dL (ref 0.0–1.0)

## 2011-12-04 NOTE — Progress Notes (Signed)
Pulse 76 Patient reports some pelvic pressure and occasional contractions Patient states she fainted on Monday while cleaning the bathroom with clorox

## 2011-12-04 NOTE — Progress Notes (Signed)
Doing well. UC x2/day and vaginal pressure worse with moving. Syncopal episode but denies LOC; no current palpitations, dizziness or weakness. Advised frequent meals, slow position changes, PNV and high iron foods.  1 hr glu was 71.

## 2011-12-04 NOTE — Patient Instructions (Signed)
Embarazo - Tercer trimestre (Pregnancy - Third Trimester) El tercer trimestre del embarazo (los ltimos 3 meses) es el perodo de cambios ms rpidos que atraviesan usted y el beb. El aumento de peso es ms rpido. El beb alcanza un largo de aproximadamente 50 cm (20 pulgadas) y pesa entre 2,700 y 4,500 kg (6 a 10 libras). El beb gana ms tejido graso y ya est listo para la vida fuera del cuerpo de la madre. Mientras estn en el interior, los bebs tienen perodos de sueo y vigilia, succionan el pulgar y tienen hipo. Quizs sienta pequeas contracciones del tero. Este es el falso trabajo de parto. Tambin se las conoce como contracciones de Braxton-Hicks. Es como una prctica del parto. Los problemas ms habituales de esta etapa del embarazo incluyen mayor dificultad para respirar, hinchazn de las manos y los pies por retencin de lquidos y la necesidad de orinar con ms frecuencia debido a que el tero y el beb presionan sobre la vejiga.  EXAMENES PRENATALES  Durante los exmenes prenatales, deber seguir realizando pruebas de sangre, segn avance el embarazo. Estas pruebas se realizan para controlar su salud y la del beb. Tambin se realizan anlisis de sangre para conocer los niveles de hemoglobina. La anemia (bajo nivel de hemoglobina) es frecuente durante el embarazo. Para prevenirla, se administran hierro y vitaminas. Tambin le harn nuevas pruebas para descartar la diabetes. Podrn repetirle algunas de las pruebas que le hicieron previamente.   En cada visita le medirn el tamao del tero. Es para asegurarse de que el beb se desarrolla correctamente.   Tambin en cada visita la pesarn. Esto se realiza para asegurarse de que aumenta de peso al ritmo indicado y que usted y su beb evolucionan normalmente.   En algunas ocasiones se realiza una ecografa para confirmar el correcto desarrollo y evolucin del beb. Esta prueba se realiza con ondas sonoras inofensivas para el beb, de modo  que el profesional pueda calcular con ms precisin la fecha del parto.   Discuta las posibilidades de la anestesia si necesita cesrea.  Algunas veces se realizan pruebas especializadas del lquido amnitico que rodea al beb. Esta prueba se denomina amniocentesis. El lquido amnitico se obtiene introduciendo una aguja en el abdomen (vientre). En ocasiones se lleva a cabo cerca del final del embarazo, si es necesario adelantar el parto. En este caso se realiza para asegurarse de que los pulmones del beb estn lo suficientemente maduros como para que pueda vivir fuera del tero. CAMBIOS QUE OCURREN EN EL TERCER TRIMESTRE DEL EMBARAZO Su organismo atravesar diferentes cambios durante el embarazo que varan de una persona a otra. Converse con el profesional que la asiste acerca los cambios que usted note y que la preocupen.  Durante el ltimo trimestre probablemente sienta un aumento del apetito. Es normal tener "antojos" de ciertas comidas. Esto vara de una persona a otra y de un embarazo a otro.   Podrn aparecer las primeras estras en las caderas, abdomen y mamas. Estos son cambios normales del cuerpo durante el embarazo. No existen medicamentos ni ejercicios que puedan prevenir estos cambios.   El estreimiento puede tratarse con un laxante o agregando fibra a su dieta. Beber grandes cantidades de lquidos, tomar fibras en forma de verduras, frutas y granos integrales es de gran ayuda.   Tambin es beneficioso practicar actividad fsica. Si ha sido una persona activa hasta el embarazo, podr continuar con la mayora de las actividades durante el mismo. Si ha sido menos activa, puede ser beneficioso   que comience con un programa de ejercicios, como realizar caminatas. Consulte con el profesional que la asiste antes de comenzar un programa de ejercicios.   Evite el consumo de cigarrillos, el alcohol, los medicamentos no prescritos y las "drogas de la calle" durante el embarazo. Estas sustancias  qumicas afectan la formacin y el desarrollo del beb. Evite estas sustancias durante todo el embarazo para asegurar el nacimiento de un beb sano.   Dolor de espalda, venas varicosas y hemorroides podran aparecer o empeorar.   Los movimientos del beb pueden ser ms bruscos y aparecer ms a menudo.   Puede que note dificultades para respirar facilmente.   El ombligo podra salrsele hacia afuera.   Puede segregar un lquido amarillento (calostro) de las mamas.   Puede segregar mucus con sangre. Esto normalmente ocurre unos pocos das a una semana antes de que comience el trabajo de parto.  INSTRUCCIONES PARA EL CUIDADO DOMICILIARIO  La mayor parte de los cuidados que se aconsejan son los mismos que los indicados para las primeras etapas del embarazo. Es importante que concurra a todas las citas con el profesional y siga sus instrucciones con respecto a los medicamentos que deba utilizar, a la actividad fsica y a la dieta.   Durante el embarazo debe obtener nutrientes para usted y para su beb. Consuma alimentos balanceados a intervalos regulares. Elija alimentos como carne, pescado, leche y otros productos lcteos descremados, verduras, frutas, panes integrales y cereales. El profesional le informar cul es el aumento de peso ideal.   Las relaciones sexuales pueden continuarse hasta casi el final del embarazo, si no se presentan otros problemas como prdida prematura (antes de tiempo) de lquido amnitico, hemorragia vaginal o dolor abdominal (en el vientre).   Realice actividad fsica todos los das, si no tiene restricciones. Consulte con el profesional que la asiste si no sabe con certeza si determinados ejercicios son seguros. El mayor aumento de peso se produce en los dos ltimos trimestres del embarazo.   Haga reposo con frecuencia, con las piernas elevadas, o segn lo necesite para evitar los calambres y el dolor de cintura.   Use un buen sostn o como los que se usan para hacer  deportes para aliviar la sensibilidad de las mamas. Tambin puede serle til si lo usa mientras duerme. Si pierde calostro, podr utilizar apsitos en el sostn.   No utilice la baera con agua caliente, baos turcos y saunas.   Colquese el cinturn de seguridad cuando conduzca. Este la proteger a usted y al beb en caso de accidente.   Evite comer carne cruda y el contacto con los utensilios y desperdicios de los gatos. Estos elementos contienen grmenes que pueden causar defectos de nacimiento en el beb.   Es fcil perder algo de orina durante el embarazo. Apretar y fortalecer los msculos de la pelvis la ayudar con este problema. Practique detener la miccin cuando est en el bao. Estos son los mismos msculos que necesita fortalecer. Son tambin los mismos msculos que utiliza cuando trata de evitar los gases. Puede practicar apretando estos msculos diez veces, y repetir esto tres veces por da aproximadamente. Una vez que conozca qu msculos debe contraer, no realice estos ejercicios durante la miccin. Puede favorecerle una infeccin si la orina vuelve hacia atrs.   Pida ayuda si tiene necesidades econmicas, de asesoramiento o nutricionales durante el embarazo. El profesional podr ayudarla con respecto a estas necesidades, o derivarla a otros especialistas.   Practique la ida hasta el hospital a modo   de prueba.   Tome clases prenatales junto con su pareja para comprender, practicar y hacer preguntas acerca del trabajo de parto y el nacimiento.   Prepare la habitacin del beb.   No viaje fuera de la ciudad a menos que sea absolutamente necesario y con el consejo del mdico.   Use slo zapatos bajos sin taco para tener un mejor equilibrio y prevenir cadas.  EL CONSUMO DE MEDICAMENTOS Y DROGAS DURANTE EL EMBARAZO  Contine tomando las vitaminas apropiadas para esta etapa tal como se le indic. Las vitaminas deben contener un miligramo de cido flico y deben suplementarse con  hierro. Guarde todas las vitaminas fuera del alcance de los nios. La ingestin de slo un par de vitaminas o comprimidos que contengan hierro pueden ocasionar la muerte en un beb o en un nio pequeo.   Evite el uso de medicamentos, inclusive los de venta libre, que no hayan sido prescritos o indicados por el profesional que la asiste. Algunos medicamentos pueden causar problemas fsicos al beb. Utilice los medicamentos de venta libre o de prescripcin para el dolor, el malestar o la fiebre, segn se lo indique el profesional que lo asiste. No utilice aspirina, ibuprofeno (Motrin, Advil, Nuprin) o naproxeno (Aleve) a menos que el profesional la autorice.   El alcohol se asocia a cierto nmero de defectos del nacimiento, incluido el sndrome de alcoholismo fetal. Debe evitar el consumo de alcohol en cualquiera de sus formas. El cigarrillo causa nacimientos prematuros y bebs de bajo peso al nacer. Las drogas de la calle son muy nocivas para el beb y estn absolutamente prohibidas. Un beb que nace de una madre adicta, ser adicto al nacer. Ese beb tendr los mismos sntomas de abstinencia que un adulto.   Infrmele al profesional si consume alguna droga.  SOLICITE ATENCIN MDICA SI: Tiene alguna preocupacin durante el embarazo. Es mejor que llame para formular las preguntas si no puede esperar hasta la prxima visita, que sentirse preocupada por ellas.  DECISIONES ACERCA DE LA CIRCUNCISIN Usted puede saber o no cul es el sexo de su beb. Si es un varn, ste es el momento de pensar acerca de la circuncisin. La circuncisin es la extirpacin del prepucio. Esta es la piel que cubre el extremo sensible del pene. No hay un motivo mdico que lo justifique. Generalmente la decisin se toma segn lo que sea popular en ese momento, o se basa en creencias religiosas. Podr conversar estos temas con el profesional que la asiste. SOLICITE ATENCIN MDICA DE INMEDIATO SI:  La temperatura oral se eleva  sin motivo por encima de 102 F (38.9 C) o segn le indique el profesional que la asiste.   Tiene una prdida de lquido por la vagina (canal de parto). Si sospecha una ruptura de las membranas, tmese la temperatura y llame al profesional para informarlo sobre esto.   Observa unas pequeas manchas, una hemorragia vaginal o elimina cogulos. Avsele al profesional acerca de la cantidad y de cuntos apsitos est utilizando.   Presenta un olor desagradable en la secrecin vaginal y observa un cambio en el color, de transparente a blanco.   Ha vomitado durante ms de 24 horas.   Presenta escalofros o fiebre.   Comienza a sentir falta de aire.   Siente ardor al orinar.   Baja o sube ms de 900 g (ms de 2 libras), o segn lo indicado por el profesional que la asiste. Observa que sbitamente se le hinchan el rostro, las manos, los pies o las   piernas.   Presenta dolor abdominal. Las molestias en el ligamento redondo son una causa benigna (no cancerosa) frecuente de dolor abdominal durante el embarazo, pero el profesional que la asiste deber evaluarlo.   Presenta dolor de cabeza intenso que no se alivia.   Si no siente los movimientos del beb durante ms de tres horas. Si piensa que el beb no se mueve tanto como lo haca habitualmente, coma algo que contenga azcar y recustese sobre el lado izquierdo durante una hora. El beb debe moverse al menos 4  5 veces por hora. Comunquese inmediatamente si el beb se mueve menos que lo indicado.   Se cae, se ve involucrada en un accidente automovilstico o sufre algn tipo de traumatismo.   En su hogar hay violencia mental o fsica.  Document Released: 01/23/2005 Document Revised: 04/04/2011 [employer] Patient Information 2012 [employer]. 

## 2011-12-06 ENCOUNTER — Encounter: Payer: Self-pay | Admitting: *Deleted

## 2011-12-06 DIAGNOSIS — O0930 Supervision of pregnancy with insufficient antenatal care, unspecified trimester: Secondary | ICD-10-CM

## 2011-12-06 DIAGNOSIS — O0933 Supervision of pregnancy with insufficient antenatal care, third trimester: Secondary | ICD-10-CM | POA: Insufficient documentation

## 2011-12-18 ENCOUNTER — Ambulatory Visit (INDEPENDENT_AMBULATORY_CARE_PROVIDER_SITE_OTHER): Payer: Self-pay | Admitting: Family Medicine

## 2011-12-18 VITALS — BP 107/68 | Temp 98.5°F | Wt 138.8 lb

## 2011-12-18 DIAGNOSIS — N898 Other specified noninflammatory disorders of vagina: Secondary | ICD-10-CM

## 2011-12-18 DIAGNOSIS — O0930 Supervision of pregnancy with insufficient antenatal care, unspecified trimester: Secondary | ICD-10-CM

## 2011-12-18 DIAGNOSIS — Z3480 Encounter for supervision of other normal pregnancy, unspecified trimester: Secondary | ICD-10-CM

## 2011-12-18 DIAGNOSIS — O9989 Other specified diseases and conditions complicating pregnancy, childbirth and the puerperium: Secondary | ICD-10-CM

## 2011-12-18 LAB — POCT URINALYSIS DIP (DEVICE)
Glucose, UA: NEGATIVE mg/dL
Nitrite: NEGATIVE
Protein, ur: NEGATIVE mg/dL
Specific Gravity, Urine: 1.02 (ref 1.005–1.030)
Urobilinogen, UA: 0.2 mg/dL (ref 0.0–1.0)

## 2011-12-18 NOTE — Progress Notes (Signed)
Pulse 83 Patient reports pelvic pressure and occasional contractions; Patient also reports thick white d/c with burning, denies itching

## 2011-12-18 NOTE — Patient Instructions (Signed)
Sndrome del tnel carpiano (Carpal Tunnel Syndrome) El tnel carpiano es una cavidad angosta en la zona de la [place]. Est formado por los [employer] y los ligamentos de la [place]. Los nervios, vasos sanguneos y tendones (estructuras similares a cuerdas que unen los msculos al [employer]) de la palma (el lado de la mano hacia dnde se doblan los dedos) pasan a travs del tnel carpiano. Los movimientos de la [place] o ciertas enfermedades pueden causar hinchazn del tnel. (Por este motivo se denominan trastornos por trauma repetitivo (la lesin causada por el uso excesivo). Tambin es un problema comn en la ltima etapa del embarazo. Esta hinchazn comprime el nervio principal en la mueca (nervio mediano) y ocasiona un trastorno doloroso que se denomina sndrome del tnel carpiano. Puede notarse una sensacin de "alfileres y agujas" en los dedos o en la mano; sin embargo, todo el brazo Barista. El sndrome del tnel carpiano puede curarse espontneamente sin tratamiento. Las inyecciones de cortisona pueden ayudar. A veces es necesaria la ciruga para liberar el nervio comprimido. Ser necesario Forensic psychologist electromiografa (un tipo de examen) para Academic librarian diagnstico (conocer el problema) Esta prueba mide la conduccin del nervio. En el sndrome del tnel carpiano, la conduccin nerviosa se hace ms lenta. INSTRUCCIONES PARA EL CUIDADO DOMICILIARIO  Si su mdico le prescribe medicacin para controlar la hinchazn, tmela como se le indica.   Si le Patent attorney una tablilla para evitar que la [place] se doble, sela como le indicaron. Es importante que use la tabilla durante la noche. Utilice la tablilla mientras sienta dolor o adormecimiento en la mano, el brazo o la [place]. Esto puede durar entre 1 y 2 meses.   Si siente dolor por la noche, puede ayudarlo si frota o sacude la mano, o la eleva sobre el nivel del corazn (el centro del Hotel manager)   Es importante que la [place] descanse, por lo que  debe suspender todas las actividades que originan el problema. Si sus sntomas (problemas) estn relacionados con Kathie Dike, deber conversar con su empleador acerca de la posibilidad de cambiar a una tarea que no requiera el uso de la [place].   Utilice los medicamentos de venta libre o de prescripcin para Chief Technology Officer, Environmental health practitioner o la [place], segn se lo indique el profesional que lo asiste.   Luego de perodos de uso extensivo, particularmente extenuantes, aplique una bolsa de hielo envuelta en una toalla en la zona anterior (palma) de la mueca afectada, durante 20  30 minutos. Repita cuando lo necesite, tres o Biomedical engineer. Esto ayuda a Building services engineer.   Siga todas instrucciones para la continuacin con su cuidador. Esto incluye una derivacin a un ortopedista, fisioterapeuta y rehabilitacin. Gildardo Griffes en obtener la atencin necesaria podra dar como resultado una demora en la curacin de la bursitis y dolor crnico.  SOLICITE ATENCIN MDICA DE INMEDIATO SI:  An siente dolor o adormecimiento despus de una semana del tratamiento.   Desarrolla nuevos e inexplicables sntomas.   Los sntomas actuales empeoran y la medicacin no los [place].  EST SEGURO QUE:   Comprende las instrucciones para el alta mdica.   Controlar su enfermedad.   Solicitar atencin mdica de inmediato segn las indicaciones.  Document Released: 04/15/2005 Document Revised: 04/04/2011 [facility] Patient Information 2012 [place], [place].  Vanetta Mulders - Systems analyst trimestre (Pregnancy - Third Trimester) El tercer trimestre del embarazo (los ltimos 3 meses) es el perodo de cambios ms rpidos que atraviesan usted y el beb. El [place]  de peso es ms rpido. El beb alcanza un largo de aproximadamente 50 cm (20 pulgadas) y pesa entre 2,700 y 4,500 kg (6 a 10 libras). El beb gana ms tejido graso y ya est listo para la vida fuera del cuerpo de la [place]. Mientras estn en el interior, los bebs tienen  perodos de sueo y vigilia, Warehouse manager y tienen hipo. Quizs sienta pequeas contracciones del tero. Este es el falso trabajo de [place]. Tambin se las conoce como contracciones de [place]. Es como una prctica del parto. Los problemas ms habituales de esta etapa del embarazo incluyen mayor dificultad para respirar, hinchazn de las manos y los pies por retencin de lquidos y la necesidad de Geographical information systems officer con ms frecuencia debido a que el tero y el beb presionan sobre la vejiga.  EXAMENES PRENATALES  Durante los [employer]power [employer], deber seguir realizando pruebas de [place], segn avance el [place]. Estas pruebas se realizan para controlar su salud y la del beb. Tambin se realizan anlisis de sangre para [employer] niveles de [place]. La anemia (bajo nivel de hemoglobina) es frecuente durante el embarazo. Para prevenirla, se administran hierro y vitaminas. Tambin le harn nuevas pruebas para descartar la diabetes. Podrn repetirle algunas de las [employer] hicieron previamente.   En cada visita le medirn el tamao del tero. Es para asegurarse de que el beb se desarrolla correctamente.   Tambin en cada visita la pesarn. Esto se realiza para asegurarse de que aumenta de peso al ritmo indicado y que usted y su beb evolucionan normalmente.   En algunas ocasiones se realiza una ecografa para confirmar el correcto desarrollo y evolucin del beb. Esta prueba se realiza con ondas sonoras inofensivas para el beb, de modo que el profesional pueda calcular con ms precisin la fecha del [employer].   Discuta las posibilidades de la anestesia si necesita cesrea.  Algunas veces se realizan pruebas especializadas del lquido amnitico que rodea al beb. Esta prueba se denomina amniocentesis. El lquido amnitico se obtiene introduciendo una aguja en el abdomen (vientre). En ocasiones se lleva a cabo cerca del final del embarazo, si es Optician, dispensing. En este caso se  realiza para asegurarse de que los pulmones del beb estn lo suficientemente maduros como para que pueda vivir fuera del tero. CAMBIOS QUE OCURREN EN EL TERCER TRIMESTRE DEL EMBARAZO Su organismo atravesar diferentes cambios durante el embarazo que varan de Neomia Dear persona a Educational psychologist. Converse con el profesional que la asiste acerca los cambios que usted note y que la preocupen.  Durante el ltimo trimestre probablemente sienta un aumento del apetito. Es normal tener "antojos" de Development worker, community. Esto vara de Neomia Dear persona a otra y de un embarazo a Therapist, art.   Podrn aparecer las primeras estras en las caderas, abdomen y [place]. Estos son cambios normales del cuerpo durante el [place]. No existen medicamentos ni ejercicios que puedan prevenir [employer].   El estreimiento puede tratarse con un laxante o agregando fibra a su dieta. Beber grandes cantidades de lquidos, tomar fibras en forma de verduras, frutas y granos integrales es de [place].   Tambin es beneficioso practicar actividad fsica. Si ha sido una persona Engineer, mining, podr continuar con la [employer]son de las actividades durante el mismo. Si ha sido [employer], puede ser beneficioso que comience con un programa de ejercicios, Museum/gallery exhibitions officer. Consulte con el profesional que la asiste antes de comenzar un programa de ejercicios.   Evite el consumo de cigarrillos,  el alcohol, los medicamentos no prescritos y las "drogas de la calle" durante el [place]. Estas sustancias qumicas afectan la formacin y el desarrollo del beb. Evite estas sustancias durante todo el embarazo para asegurar el nacimiento de un beb sano.   Dolor de espalda, venas varicosas y hemorroides podran aparecer o empeorar.   Los movimientos del beb pueden ser ms bruscos y aparecer ms a menudo.   Puede que note dificultades para respirar facilmente.   El ombligo podra salrsele hacia afuera.   Puede segregar un lquido amarillento (calostro)  de las [place].   Puede segregar mucus con sangre. Esto normalmente ocurre unos [address] a una semana antes de que comience el [place] de [place].  INSTRUCCIONES PARA EL CUIDADO DOMICILIARIO  La mayor parte de los cuidados que se aconsejan son los mismos que los indicados para las primeras etapas del Psychiatrist. Es importante que concurra a todas las citas con el profesional y siga sus instrucciones con Camera operator a los medicamentos que deba Chemical engineer, a la actividad fsica y a Psychologist, forensic.   Durante el embarazo debe obtener nutrientes para usted y para su beb. Consuma alimentos balanceados a intervalos regulares. Elija alimentos como carne, pescado, [place] y otros productos lcteos descremados, verduras, frutas, panes integrales y cereales. El Equities trader cul es el aumento de peso ideal.   Las relaciones sexuales pueden continuarse hasta casi el final del embarazo, si no se presentan otros problemas como prdida prematura (antes de tiempo) de lquido amnitico, hemorragia vaginal o dolor abdominal (en el vientre).   Realice [employer], si no tiene restricciones. Consulte con el profesional que la asiste si no sabe con certeza si determinados ejercicios son seguros. El mayor aumento de peso se produce [employer]ocker ltimos trimestres del [place].   Haga reposo con frecuencia, con las piernas elevadas, o segn lo necesite para evitar los calambres y el dolor de cintura.   Use un buen sostn o como los que se usan para hacer deportes para Paramedic la sensibilidad de las [place]. Tambin puede serle til si lo [place] mientras duerme. Si pierde Product manager, podr [employer].   No utilice la baera con agua caliente, baos turcos y saunas.   Colquese el cinturn de seguridad cuando conduzca. Este la proteger a usted y al beb en caso de accidente.   Evite comer carne cruda y el contacto con los utensilios y desperdicios de los gatos. Estos elementos contienen  grmenes que pueden causar defectos de nacimiento en el beb.   Es fcil perder algo de orina durante el [place]. Apretar y Chief Operating Officer los msculos de la pelvis la ayudar con este problema. Practique detener la miccin cuando est en el bao. Estos son los mismos msculos que Development worker, international aid. Son [employer] mismos msculos que utiliza cuando trata de [employer]der System gases. Puede practicar apretando estos msculos WellPoint, y repetir esto tres veces por da aproximadamente. Una vez que conozca qu msculos debe contraer, no realice estos ejercicios durante la miccin. Puede favorecerle una infeccin si la orina vuelve hacia atrs.   Pida ayuda si tiene necesidades econmicas, de asesoramiento o nutricionales durante el [place]. El profesional podr ayudarla con respecto a estas necesidades, o derivarla a otros especialistas.   Practique la ida [employer] hospital a modo de [place].   Tome clases prenatales junto con su pareja para comprender, practicar y hacer preguntas acerca del Aleen Campi de parto y el nacimiento.   Prepare la habitacin del  beb.   No viaje fuera de la ciudad a menos que sea absolutamente necesario y con el consejo del mdico.   Use slo zapatos bajos sin taco para tener un mejor equilibrio y prevenir cadas.  EL CONSUMO DE MEDICAMENTOS Y DROGAS DURANTE EL EMBARAZO  Contine tomando las vitaminas apropiadas para esta etapa tal como se le indic. Las vitaminas deben contener un miligramo de cido flico y deben suplementarse con hierro. Guarde todas las vitaminas fuera del alcance de los nios. La ingestin de slo un par de vitaminas o comprimidos que contengan hierro pueden ocasionar la [employer] Mining en un beb o en un nio pequeo.   Evite el uso de [place], inclusive los de venta [place], que no hayan sido prescritos o indicados por el profesional que la asiste. Algunos medicamentos pueden causar problemas fsicos al beb. Utilice los medicamentos de venta libre o de prescripcin  para Chief Technology Officer, Environmental health practitioner o la [place], segn se lo indique el profesional que lo asiste. No utilice aspirina, ibuprofeno (Motrin, Advil, Nuprin) o naproxeno (Aleve) a menos que el profesional la autorice.   El alcohol se asocia a cierto nmero de defectos del nacimiento, incluido el sndrome de alcoholismo fetal. Debe evitar el consumo de alcohol en cualquiera de sus formas. El cigarrillo causa nacimientos prematuros y bebs de bajo peso al nacer. Las drogas de la calle son muy nocivas para el beb y estn absolutamente prohibidas. Un beb que nace de [employer], ser adicto al nacer. Ese beb tendr los mismos sntomas de abstinencia que un adulto.   Infrmele al profesional si consume alguna droga.  SOLICITE ATENCIN MDICA SI: Tiene alguna preocupacin Academic librarian. Es mejor que llame para formular las preguntas si no puede esperar hasta la prxima visita, que sentirse preocupada por ellas.  DECISIONES ACERCA DE LA CIRCUNCISIN Usted puede saber o no cul es el sexo de su beb. Si es un varn, ste es el momento de pensar acerca de la circuncisin. La circuncisin es la extirpacin del prepucio. Esta es la piel que cubre el extremo sensible del pene. No hay un motivo mdico que lo justifique. Generalmente la decisin se toma segn lo que sea popular en ese momento, o se basa en creencias religiosas. Podr conversar estos temas con el profesional que la asiste. SOLICITE ATENCIN MDICA DE INMEDIATO SI:  La temperatura oral se eleva sin motivo por encima de 102 F (38.9 C) o segn le indique el profesional que la asiste.   Tiene una prdida de lquido por la vagina (canal de parto). Si sospecha una ruptura de las [place], tmese la temperatura y llame al profesional para informarlo sobre esto.   Observa unas pequeas manchas, una hemorragia vaginal o elimina cogulos. Avsele al profesional acerca de la cantidad y de cuntos apsitos est utilizando.   Presenta un olor  desagradable en la secrecin vaginal y observa un cambio en el color, de transparente a blanco.   Ha vomitado durante ms de 24 horas.   Presenta escalofros o fiebre.   Comienza a sentir falta de aire.   Siente ardor al Beatrix Shipper.   [place] o sube ms de 900 g (ms de 2 libras), o segn lo indicado por el profesional que la asiste. Observa que sbitamente se le hinchan el rostro, las manos, los pies o las piernas.   Presenta dolor abdominal. Las molestias en el ligamento redondo son Neomia Dear causa benigna (no cancerosa) frecuente de Engineer, mining abdominal durante el Psychiatrist, pero el profesional que la asiste  deber evaluarlo.   Presenta dolor de cabeza intenso que no se [place].   Si no siente los movimientos del beb durante ms de tres horas. Si piensa que el beb no se mueve tanto como lo haca habitualmente, coma algo que Psychologist, clinical y Target Corporation lado izquierdo durante [place]. El beb debe moverse al menos 4  5 veces por hora. Comunquese inmediatamente si el beb se mueve menos que lo indicado.   Se cae, se ve involucrada en un accidente automovilstico o sufre algn tipo de traumatismo.   En su hogar hay violencia mental o fsica.  Document Released: 01/23/2005 Document Revised: 04/04/2011 [facility] Patient Information 2012 [place], [place].

## 2011-12-18 NOTE — Progress Notes (Signed)
Occasional BH ctx. Pt c/o thick white vaginal discharge and burning when wiping after urination. Wet prep and GC done today.Pt also c/o left shoulder/chest pain with tingling in left arm once or twice recently. Pain was sharp/stabbing and accompanied by shortness of breath. Happens when sleeping/lying down, relieved by sitting/standing up. Likely musculockeletal. Advised pt to report to MAU/ED if pain recurs and is not relieved by position change. Also has frequent numbness/tingling in both hands and forearms when sitting still. No weakness. DTRs, strength, sensation intact. Likely carpal tunnel syndrome or other nerve entrapment/neuropathy. Pt advised to go to MAU/ED if any weakness, loss of sensation or change in mental status/speech.

## 2011-12-19 LAB — WET PREP, GENITAL: Yeast Wet Prep HPF POC: NONE SEEN

## 2011-12-19 LAB — GC/CHLAMYDIA PROBE AMP, GENITAL
Chlamydia, DNA Probe: NEGATIVE
GC Probe Amp, Genital: NEGATIVE

## 2012-01-01 ENCOUNTER — Ambulatory Visit (INDEPENDENT_AMBULATORY_CARE_PROVIDER_SITE_OTHER): Payer: Self-pay | Admitting: Obstetrics and Gynecology

## 2012-01-01 VITALS — BP 97/64 | Temp 97.1°F | Wt 141.0 lb

## 2012-01-01 DIAGNOSIS — O0930 Supervision of pregnancy with insufficient antenatal care, unspecified trimester: Secondary | ICD-10-CM

## 2012-01-01 LAB — POCT URINALYSIS DIP (DEVICE)
Bilirubin Urine: NEGATIVE
Ketones, ur: NEGATIVE mg/dL
Leukocytes, UA: NEGATIVE
Specific Gravity, Urine: 1.015 (ref 1.005–1.030)
pH: 5.5 (ref 5.0–8.0)

## 2012-01-01 LAB — OB RESULTS CONSOLE GBS: GBS: NEGATIVE

## 2012-01-01 NOTE — Patient Instructions (Signed)
Mtodo para contar los movimientos fetales (Fetal Movement Counts) Nombre de la paciente: __________________________________________________ Connie Boyd probable de parto:____________________ En los embarazos de alto riesgo se recomienda contar las pataditas, pero tambin es una buena idea que lo hagan todas las [place]. Comience a contarlas a las 28 semanas de embarazo. Los movimientos fetales aumentan luego de una comida Immunologist o de comer o beber algo dulce (el nivel de azcar en la sangre est ms alto). Tambin es importante beber gran cantidad de lquidos (hidratarse bien) antes de contar. Si se recuesta sobre el lado izquierdo mejorar la Designer, industrial/product, o puede sentarse en una silla cmoda con los brazos sobre el abdomen y sin distracciones que la rodeen. CONTANDO  Trate de contar a la [employer] Corporation lo haga.   Marque el da y la hora y vea cunto le lleva sentir 10 movimientos (patadas, agitaciones, sacudones, vueltas). Debe sentir al menos 10 movimientos en 2 horas. Probablemente sienta los 10 movimientos en menos de dos horas. Si no los siente, espere una hora y cuente nuevamente. Luego de [employer] tendr un patrn.   Debemos observar si hay cambios en el patrn o no hay suficientes pataditas en 2 horas. Le lleva ms tiempo contar los 10 movimientos?  SOLICITE ATENCIN MDICA SI:  Siente menos de 10 pataditas en 2 horas. Intntelo dos veces.   No siente movimientos durante 1 hora.   El patrn se modifica o le lleva ms tiempo Art gallery manager las 10 pataditas.   Siente que el beb no se mueve como lo hace habitualmente.  Fecha: ____________ Movimientos: ____________ Comienzo hora: ____________ Connie Boyd: ____________ Connie Boyd: ____________ Movimientos: ____________ Comienzo hora: ____________ Connie Boyd: ____________ Connie Boyd: ____________ Movimientos: ____________ Comienzo hora: ____________ Connie Boyd: ____________ Connie Boyd: ____________ Movimientos: ____________ Comienzo hora:  ____________ Connie Boyd: ____________ Connie Boyd: ____________ Movimientos: ____________ Comienzo hora: ____________ Connie Boyd: ____________ Connie Boyd: ____________ Movimientos: ____________ Comienzo hora: ____________ Connie Boyd: ____________ Connie Boyd: ____________ Movimientos: ____________ Comienzo hora: ____________ Connie Boyd: ____________  Connie Boyd: ____________ Movimientos: ____________ Comienzo hora: ____________ Connie Boyd: ____________ Connie Boyd: ____________ Movimientos: ____________ Comienzo hora: ____________ Connie Boyd: ____________ Connie Boyd: ____________ Movimientos: ____________ Comienzo hora: ____________ Connie Boyd: ____________ Connie Boyd: ____________ Movimientos: ____________ Comienzo hora: ____________ Connie Boyd: ____________ Connie Boyd: ____________ Movimientos: ____________ Comienzo hora: ____________ Connie Boyd: ____________ Connie Boyd: ____________ Movimientos: ____________ Comienzo hora: ____________ Connie Boyd: ____________ Connie Boyd: ____________ Movimientos: ____________ Comienzo hora: ____________ Connie Boyd: ____________  Connie Boyd: ____________ Movimientos: ____________ Comienzo hora: ____________ Connie Boyd: ____________ Connie Boyd: ____________ Movimientos: ____________ Comienzo hora: ____________ Connie Boyd: ____________ Connie Boyd: ____________ Movimientos: ____________ Comienzo hora: ____________ Connie Boyd: ____________ Connie Boyd: ____________ Movimientos: ____________ Comienzo hora: ____________ Connie Boyd: ____________ Connie Boyd: ____________ Movimientos: ____________ Comienzo hora: ____________ Connie Boyd: ____________ Connie Boyd: ____________ Movimientos: ____________ Comienzo hora: ____________ Connie Boyd: ____________ Connie Boyd: ____________ Movimientos: ____________ Comienzo hora: ____________ Connie Boyd: ____________  Connie Boyd: ____________ Movimientos: ____________ Comienzo hora: ____________ Connie Boyd: ____________ Connie Boyd: ____________ Movimientos: ____________ Comienzo hora: ____________ Connie Boyd: ____________ Connie Boyd: ____________ Movimientos: ____________  Comienzo hora: ____________ Connie Boyd: ____________ Connie Boyd: ____________ Movimientos: ____________ Comienzo hora: ____________ Connie Boyd: ____________ Connie Boyd: ____________ Movimientos: ____________ Comienzo hora: ____________ Connie Boyd: ____________ Connie Boyd: ____________ Movimientos: ____________ Comienzo hora: ____________ Connie Boyd: ____________ Connie Boyd: ____________ Movimientos: ____________ Comienzo hora: ____________ Connie Boyd: ____________  Connie Boyd: ____________ Movimientos: ____________ Comienzo hora: ____________ Connie Boyd: ____________ Connie Boyd: ____________ Movimientos: ____________ Comienzo hora: ____________ Connie Boyd: ____________ Connie Boyd: ____________ Movimientos: ____________ Comienzo hora: ____________ Connie Boyd: ____________ Connie Boyd: ____________ Movimientos: ____________ Comienzo hora: ____________ Connie Boyd: ____________ Connie Boyd: ____________ Movimientos:  ____________ Comienzo hora: ____________ Connie Boyd: ____________ Connie Boyd: ____________ Movimientos: ____________ Comienzo hora: ____________ Connie Boyd: ____________ Connie Boyd: ____________ Movimientos: ____________ Comienzo hora: ____________ Connie Boyd: ____________  Connie Boyd: ____________ Movimientos: ____________ Comienzo hora: ____________ Connie Boyd: ____________ Connie Boyd: ____________ Movimientos: ____________ Comienzo hora: ____________ Connie Boyd: ____________ Connie Boyd: ____________ Movimientos: ____________ Comienzo hora: ____________ Connie Boyd: ____________ Connie Boyd: ____________ Movimientos: ____________ Comienzo hora: ____________ Connie Boyd: ____________ Connie Boyd: ____________ Movimientos: ____________ Comienzo hora: ____________ Connie Boyd: ____________ Connie Boyd: ____________ Movimientos: ____________ Comienzo hora: ____________ Connie Boyd: ____________ Connie Boyd: ____________ Movimientos: ____________ Comienzo hora: ____________ Connie Boyd: ____________  Connie Boyd: ____________ Movimientos: ____________ Comienzo hora: ____________ Connie Boyd: ____________ Connie Boyd: ____________ Movimientos:  ____________ Comienzo hora: ____________ Connie Boyd: ____________ Connie Boyd: ____________ Movimientos: ____________ Comienzo hora: ____________ Connie Boyd: ____________ Connie Boyd: ____________ Movimientos: ____________ Comienzo hora: ____________ Connie Boyd: ____________ Connie Boyd: ____________ Movimientos: ____________ Comienzo hora: ____________ Connie Boyd: ____________ Connie Boyd: ____________ Movimientos: ____________ Comienzo hora: ____________ Connie Boyd: ____________ Connie Boyd: ____________ Movimientos: ____________ Comienzo hora: ____________ Connie Boyd: ____________  Connie Boyd: ____________ Movimientos: ____________ Comienzo hora: ____________ Connie Boyd: ____________ Connie Boyd: ____________ Movimientos: ____________ Comienzo hora: ____________ Connie Boyd: ____________ Connie Boyd: ____________ Movimientos: ____________ Comienzo hora: ____________ Connie Boyd: ____________ Connie Boyd: ____________ Movimientos: ____________ Comienzo hora: ____________ Connie Boyd: ____________ Connie Boyd: ____________ Movimientos: ____________ Comienzo hora: ____________ Connie Boyd: ____________ Connie Boyd: ____________ Movimientos: ____________ Comienzo hora: ____________ Connie Boyd: ____________ Connie Boyd: ____________ Movimientos: ____________ Comienzo hora: ____________ Fin hora: ____________  Document Released: 07/23/2007 Document Revised: 04/04/2011 [employer] Patient Information 2012 [place], [place].

## 2012-01-01 NOTE — Progress Notes (Signed)
Increase in pelvic pressure and occ UCs. Acid reflux discussed. No tx yet. Recommend antacid.Denies depression; had mild PP depression. Never on antidepressant. GBS GC/CT done.

## 2012-01-01 NOTE — Progress Notes (Signed)
Pulse 76. C/o pelvic pain and pressure. C/o vaginal d/c as thick white; states burns when she wipes after urination.

## 2012-01-02 LAB — GC/CHLAMYDIA PROBE AMP, URINE: GC Probe Amp, Urine: NEGATIVE

## 2012-01-04 LAB — CULTURE, BETA STREP (GROUP B ONLY)

## 2012-01-15 ENCOUNTER — Ambulatory Visit (INDEPENDENT_AMBULATORY_CARE_PROVIDER_SITE_OTHER): Payer: Self-pay | Admitting: Advanced Practice Midwife

## 2012-01-15 VITALS — BP 103/64 | Temp 98.8°F | Wt 144.5 lb

## 2012-01-15 DIAGNOSIS — O0930 Supervision of pregnancy with insufficient antenatal care, unspecified trimester: Secondary | ICD-10-CM

## 2012-01-15 LAB — POCT URINALYSIS DIP (DEVICE)
Bilirubin Urine: NEGATIVE
Glucose, UA: NEGATIVE mg/dL
Nitrite: NEGATIVE
Urobilinogen, UA: 0.2 mg/dL (ref 0.0–1.0)

## 2012-01-15 NOTE — Patient Instructions (Signed)
Normal Labor and Delivery Your caregiver must first be sure you are in labor. Signs of labor include:  You may pass what is called "the mucus plug" before labor begins. This is a small amount of blood stained mucus.   Regular uterine contractions.   The time between contractions get closer together.   The discomfort and pain gradually gets more intense.   Pains are mostly located in the back.   Pains get worse when walking.   The cervix (the opening of the uterus becomes thinner (begins to efface) and opens up (dilates).  Once you are in labor and admitted into the hospital or care center, your caregiver will do the following:  A complete physical examination.   Check your vital signs (blood pressure, pulse, temperature and the fetal heart rate).   Do a vaginal examination (using a sterile glove and lubricant) to determine:   The position (presentation) of the baby (head [vertex] or buttock first).   The level (station) of the baby's head in the birth canal.   The effacement and dilatation of the cervix.   You may have your pubic hair shaved and be given an enema depending on your caregiver and the circumstance.   An electronic monitor is usually placed on your abdomen. The monitor follows the length and intensity of the contractions, as well as the baby's heart rate.   Usually, your caregiver will insert an IV in your arm with a bottle of sugar water. This is done as a precaution so that medications can be given to you quickly during labor or delivery.  NORMAL LABOR AND DELIVERY IS DIVIDED UP INTO 3 STAGES: First Stage This is when regular contractions begin and the cervix begins to efface and dilate. This stage can last from 3 to 15 hours. The end of the first stage is when the cervix is 100% effaced and 10 centimeters dilated. Pain medications may be given by   Injection (morphine, demerol, etc.)   Regional anesthesia (spinal, caudal or epidural, anesthetics given in  different locations of the spine). Paracervical pain medication may be given, which is an injection of and anesthetic on each side of the cervix.  A pregnant woman may request to have "Natural Childbirth" which is not to have any medications or anesthesia during her labor and delivery. Second Stage This is when the baby comes down through the birth canal (vagina) and is born. This can take 1 to 4 hours. As the baby's head comes down through the birth canal, you may feel like you are going to have a bowel movement. You will get the urge to bear down and push until the baby is delivered. As the baby's head is being delivered, the caregiver will decide if an episiotomy (a cut in the perineum and vagina area) is needed to prevent tearing of the tissue in this area. The episiotomy is sewn up after the delivery of the baby and placenta. Sometimes a mask with nitrous oxide is given for the mother to breath during the delivery of the baby to help if there is too much pain. The end of Stage 2 is when the baby is fully delivered. Then when the umbilical cord stops pulsating it is clamped and cut. Third Stage The third stage begins after the baby is completely delivered and ends after the placenta (afterbirth) is delivered. This usually takes 5 to 30 minutes. After the placenta is delivered, a medication is given either by intravenous or injection to help contract   the uterus and prevent bleeding. The third stage is not painful and pain medication is usually not necessary. If an episiotomy was done, it is repaired at this time. After the delivery, the mother is watched and monitored closely for 1 to 2 hours to make sure there is no postpartum bleeding (hemorrhage). If there is a lot of bleeding, medication is given to contract the uterus and stop the bleeding. Document Released: 01/23/2008 Document Revised: 04/04/2011 Document Reviewed: 01/23/2008 [employer] Patient Information 2012 [employer]. 

## 2012-01-15 NOTE — Progress Notes (Signed)
Pulse: 77

## 2012-01-15 NOTE — Progress Notes (Signed)
Doing well. More contractions this week. Reviewed signs of labor. GBS and cultures negative.

## 2012-01-22 ENCOUNTER — Ambulatory Visit (INDEPENDENT_AMBULATORY_CARE_PROVIDER_SITE_OTHER): Payer: Self-pay | Admitting: Advanced Practice Midwife

## 2012-01-22 VITALS — BP 114/63 | Temp 98.1°F | Wt 147.2 lb

## 2012-01-22 DIAGNOSIS — Z23 Encounter for immunization: Secondary | ICD-10-CM

## 2012-01-22 DIAGNOSIS — O0930 Supervision of pregnancy with insufficient antenatal care, unspecified trimester: Secondary | ICD-10-CM

## 2012-01-22 DIAGNOSIS — Z3480 Encounter for supervision of other normal pregnancy, unspecified trimester: Secondary | ICD-10-CM

## 2012-01-22 LAB — POCT URINALYSIS DIP (DEVICE)
Bilirubin Urine: NEGATIVE
Glucose, UA: NEGATIVE mg/dL
Leukocytes, UA: NEGATIVE
Nitrite: NEGATIVE

## 2012-01-22 MED ORDER — INFLUENZA VIRUS VACC SPLIT PF IM SUSP
0.5000 mL | Freq: Once | INTRAMUSCULAR | Status: AC
  Administered 2012-01-22: 0.5 mL via INTRAMUSCULAR

## 2012-01-22 NOTE — Progress Notes (Signed)
P=75, c/o edema in feet /hands, Used Interpreter, c./o pelvic pain /pressure , and also in lower back,

## 2012-01-22 NOTE — Progress Notes (Signed)
Pt states she is feeling well. Interpreter services utilized. No new pain/pressure. 3-4 contractions in an hour but not very different from last week. No bleeding/leakage of fluid, good movement. Discussed when to call clinic or come to [facility].

## 2012-01-22 NOTE — Patient Instructions (Signed)
Mtodo para contar los movimientos fetales (Fetal Movement Counts) Nombre de la paciente: __________________________________________________ Fecha probable de parto:____________________ En los embarazos de alto riesgo se recomienda contar las pataditas, pero tambin es una buena idea que lo hagan todas las embarazadas. Comience a contarlas a las 28 semanas de embarazo. Los movimientos fetales aumentan luego de una comida completa o de comer o beber algo dulce (el nivel de azcar en la sangre est ms alto). Tambin es importante beber gran cantidad de lquidos (hidratarse bien) antes de contar. Si se recuesta sobre el lado izquierdo mejorar la circulacin, o puede sentarse en una silla cmoda con los brazos sobre el abdomen y sin distracciones que la rodeen. CONTANDO  Trate de contar a la misma hora todos los das que lo haga.   Marque el da y la hora y vea cunto le lleva sentir 10 movimientos (patadas, agitaciones, sacudones, vueltas). Debe sentir al menos 10 movimientos en 2 horas. Probablemente sienta los 10 movimientos en menos de dos horas. Si no los siente, espere una hora y cuente nuevamente. Luego de algunos das tendr un patrn.   Debemos observar si hay cambios en el patrn o no hay suficientes pataditas en 2 horas. Le lleva ms tiempo contar los 10 movimientos?  SOLICITE ATENCIN MDICA SI:  Siente menos de 10 pataditas en 2 horas. Intntelo dos veces.   No siente movimientos durante 1 hora.   El patrn se modifica o le lleva ms tiempo cada da contar las 10 pataditas.   Siente que el beb no se mueve como lo hace habitualmente.  Fecha: ____________ Movimientos: ____________ Comienzo hora: ____________ Fin hora: ____________ Fecha: ____________ Movimientos: ____________ Comienzo hora: ____________ Fin hora: ____________ Fecha: ____________ Movimientos: ____________ Comienzo hora: ____________ Fin hora: ____________ Fecha: ____________ Movimientos: ____________ Comienzo hora:  ____________ Fin hora: ____________ Fecha: ____________ Movimientos: ____________ Comienzo hora: ____________ Fin hora: ____________ Fecha: ____________ Movimientos: ____________ Comienzo hora: ____________ Fin hora: ____________ Fecha: ____________ Movimientos: ____________ Comienzo hora: ____________ Fin hora: ____________  Fecha: ____________ Movimientos: ____________ Comienzo hora: ____________ Fin hora: ____________ Fecha: ____________ Movimientos: ____________ Comienzo hora: ____________ Fin hora: ____________ Fecha: ____________ Movimientos: ____________ Comienzo hora: ____________ Fin hora: ____________ Fecha: ____________ Movimientos: ____________ Comienzo hora: ____________ Fin hora: ____________ Fecha: ____________ Movimientos: ____________ Comienzo hora: ____________ Fin hora: ____________ Fecha: ____________ Movimientos: ____________ Comienzo hora: ____________ Fin hora: ____________ Fecha: ____________ Movimientos: ____________ Comienzo hora: ____________ Fin hora: ____________  Fecha: ____________ Movimientos: ____________ Comienzo hora: ____________ Fin hora: ____________ Fecha: ____________ Movimientos: ____________ Comienzo hora: ____________ Fin hora: ____________ Fecha: ____________ Movimientos: ____________ Comienzo hora: ____________ Fin hora: ____________ Fecha: ____________ Movimientos: ____________ Comienzo hora: ____________ Fin hora: ____________ Fecha: ____________ Movimientos: ____________ Comienzo hora: ____________ Fin hora: ____________ Fecha: ____________ Movimientos: ____________ Comienzo hora: ____________ Fin hora: ____________ Fecha: ____________ Movimientos: ____________ Comienzo hora: ____________ Fin hora: ____________  Fecha: ____________ Movimientos: ____________ Comienzo hora: ____________ Fin hora: ____________ Fecha: ____________ Movimientos: ____________ Comienzo hora: ____________ Fin hora: ____________ Fecha: ____________ Movimientos: ____________  Comienzo hora: ____________ Fin hora: ____________ Fecha: ____________ Movimientos: ____________ Comienzo hora: ____________ Fin hora: ____________ Fecha: ____________ Movimientos: ____________ Comienzo hora: ____________ Fin hora: ____________ Fecha: ____________ Movimientos: ____________ Comienzo hora: ____________ Fin hora: ____________ Fecha: ____________ Movimientos: ____________ Comienzo hora: ____________ Fin hora: ____________  Fecha: ____________ Movimientos: ____________ Comienzo hora: ____________ Fin hora: ____________ Fecha: ____________ Movimientos: ____________ Comienzo hora: ____________ Fin hora: ____________ Fecha: ____________ Movimientos: ____________ Comienzo hora: ____________ Fin hora: ____________ Fecha: ____________ Movimientos: ____________ Comienzo hora: ____________ Fin hora: ____________ Fecha: ____________ Movimientos:   ____________ Comienzo hora: ____________ Fin hora: ____________ Fecha: ____________ Movimientos: ____________ Comienzo hora: ____________ Fin hora: ____________ Fecha: ____________ Movimientos: ____________ Comienzo hora: ____________ Fin hora: ____________  Fecha: ____________ Movimientos: ____________ Comienzo hora: ____________ Fin hora: ____________ Fecha: ____________ Movimientos: ____________ Comienzo hora: ____________ Fin hora: ____________ Fecha: ____________ Movimientos: ____________ Comienzo hora: ____________ Fin hora: ____________ Fecha: ____________ Movimientos: ____________ Comienzo hora: ____________ Fin hora: ____________ Fecha: ____________ Movimientos: ____________ Comienzo hora: ____________ Fin hora: ____________ Fecha: ____________ Movimientos: ____________ Comienzo hora: ____________ Fin hora: ____________ Fecha: ____________ Movimientos: ____________ Comienzo hora: ____________ Fin hora: ____________  Fecha: ____________ Movimientos: ____________ Comienzo hora: ____________ Fin hora: ____________ Fecha: ____________ Movimientos:  ____________ Comienzo hora: ____________ Fin hora: ____________ Fecha: ____________ Movimientos: ____________ Comienzo hora: ____________ Fin hora: ____________ Fecha: ____________ Movimientos: ____________ Comienzo hora: ____________ Fin hora: ____________ Fecha: ____________ Movimientos: ____________ Comienzo hora: ____________ Fin hora: ____________ Fecha: ____________ Movimientos: ____________ Comienzo hora: ____________ Fin hora: ____________ Fecha: ____________ Movimientos: ____________ Comienzo hora: ____________ Fin hora: ____________  Fecha: ____________ Movimientos: ____________ Comienzo hora: ____________ Fin hora: ____________ Fecha: ____________ Movimientos: ____________ Comienzo hora: ____________ Fin hora: ____________ Fecha: ____________ Movimientos: ____________ Comienzo hora: ____________ Fin hora: ____________ Fecha: ____________ Movimientos: ____________ Comienzo hora: ____________ Fin hora: ____________ Fecha: ____________ Movimientos: ____________ Comienzo hora: ____________ Fin hora: ____________ Fecha: ____________ Movimientos: ____________ Comienzo hora: ____________ Fin hora: ____________ Fecha: ____________ Movimientos: ____________ Comienzo hora: ____________ Fin hora: ____________  Document Released: 07/23/2007 Document Revised: 04/04/2011 [employer] Patient Information 2012 [employer].  Trabajo de parto y parto normal (Normal Labor and Delivery) En primer lugar, su mdico debe estar seguro de que usted est en trabajo de parto. Algunos signos son:  Puede haber eliminado el "tapn mucoso" antes que comience el trabajo de parto. Se trata de una pequea cantidad de mucus con sangre.   Tiene contracciones uterinas regulares.   El tiempo entre las contracciones se acorta.   Las molestias y el dolor se hacen gradualmente ms intensos.   El dolor se ubica principalmente en la espalda.   Los dolores empeoran al caminar.   El cuello del tero (la apertura del  tero se hace ms delgada, comienza a borrarse, y se abre (se dilata).  Una vez que se encuentre en trabajo de parto y sea admitida en el hospital, el mdico har lo siguiente:  Un examen fsico completo.   Controlar sus signos vitales (presin arterial, pulso, temperatura y la frecuencia cardaca fetal).   Realizar un examen vaginal (usando un guante estril y lubricante para determinar:   La posicin (presentacin) del beb (ceflica [vertex] o nalgas primero).   El nivel (plano) de la cabeza del beb en el canal de parto.   El borramiento y dilatacin del cuello del tero.   Le rasurarn el vello pbico y le aplicarn una enema segn lo considere el mdico y las circunstancias.   Generalmente se coloca un monitor electrnico sobre el abdomen. El monitor sigue la duracin e intensidad de las contracciones, as como la frecuencia cardaca del beb.   Generalmente, el profesional inserta una va intravenosa en el brazo para administrarle agua azucarada. Esta es una medida de precaucin, de modo que puedan administrarle rpidamente medicamentos durante el trabajo de parto.  EL TRABAJO DE PARTO Y PARTO NORMALES SE DIVIDEN EN 3 ETAPAS: Primera etapa Comienzan las contracciones regulares y el cuello comienza a borrarse y dilatarse. Esta etapa puede durar entre 3 y 15 horas. El final de la primera etapa se considera cuando el cuello est borrado en un   100% y se ha dilatado 10 cm. Le administrarn analgsicos por:  Inyeccin (morfina, demerol, etc.).   Anestesia regional (espinal, caudal o epidural, anestsicos colocados en diferentes regiones de la columna vertebral). Podrn administrarle medicamentos para el dolor en la regin paracervical, que consiste en la aplicacin de un anestsico inyectable en cada uno de los lados del cuello del tero.  La embarazada puede requerir un "parto natural" , es decir no recibir medicamentos o anestesia durante el trabajo de parto y el parto. Segunda  etapa En este momento el beb baja a travs del canal de parto (vagina) y nace. Esto puede durar entre 1 y 4 horas. A medida que el beb asoma la cabeza por el canal de parto, podr sentir una sensacin similar a cuando mueve el intestino. Sentir el impulse de empujar con fuerza hasta que el nio salga. A medida que la cabecita baja, el mdico decidir si realiza una episiotoma (corte en el perineo y rea de la vagina) para evitar la ruptura de los tejidos). Luego del nacimiento del beb y la expulsin de la placenta, la episiotoma se sutura. En algunos casos se coloca a la madre una mscara con xido nitroso para facilitar la respiracin y aliviar el dolor. El final de la etapa 2 se produce cuando el beb ha salido completamente. Luego, cuando el cordn umbilical deja de pulsar, se pinza y se corta. Tercera etapa La tercera etapa comienza luego que el beb ha nacido y finaliza luego de la expulsin de la placenta. Generalmente esto lleva entre 5 y 30 minutos. Luego de la expulsin de la placenta, le aplicarn un medicamento por va intravenosa para ayudar a contraer el tero y prevenir hemorragias. En la tercera etapa no hay dolor y generalmente no son necesarios los analgsicos. Si le han realizado una episiotoma, es el momento de repararla. Luego del parto, la mam es observada y controlada exhaustivamente durante 1  2 horas para verificar que no hay sangrado en el post parto (hemorragias). Si pierde mucha sangre, le administrarn un medicamento para contraer el tero y detener la hemorragia. Document Released: 03/28/2008 Document Revised: 04/04/2011 [employer] Patient Information 2012 [employer]. 

## 2012-01-29 ENCOUNTER — Inpatient Hospital Stay (HOSPITAL_COMMUNITY)
Admission: AD | Admit: 2012-01-29 | Discharge: 2012-01-29 | Disposition: A | Discharge status: Home / Self Care | Payer: Self-pay | Source: Ambulatory Visit | Attending: Family Medicine | Admitting: Family Medicine

## 2012-01-29 ENCOUNTER — Ambulatory Visit (INDEPENDENT_AMBULATORY_CARE_PROVIDER_SITE_OTHER): Payer: Self-pay | Admitting: Obstetrics and Gynecology

## 2012-01-29 ENCOUNTER — Encounter (HOSPITAL_COMMUNITY): Payer: Self-pay | Admitting: *Deleted

## 2012-01-29 VITALS — BP 113/65 | Temp 97.7°F | Wt 147.3 lb

## 2012-01-29 DIAGNOSIS — O99340 Other mental disorders complicating pregnancy, unspecified trimester: Secondary | ICD-10-CM

## 2012-01-29 DIAGNOSIS — O0930 Supervision of pregnancy with insufficient antenatal care, unspecified trimester: Secondary | ICD-10-CM

## 2012-01-29 DIAGNOSIS — O479 False labor, unspecified: Secondary | ICD-10-CM | POA: Insufficient documentation

## 2012-01-29 LAB — POCT URINALYSIS DIP (DEVICE)
Bilirubin Urine: NEGATIVE
Ketones, ur: NEGATIVE mg/dL
Leukocytes, UA: NEGATIVE
Protein, ur: NEGATIVE mg/dL

## 2012-01-29 MED ORDER — LACTATED RINGERS IV SOLN: INTRAVENOUS | Status: DC

## 2012-01-29 NOTE — Progress Notes (Signed)
Pulse: 75

## 2012-01-29 NOTE — Patient Instructions (Addendum)
Embarazo  Tercer trimestre  (Pregnancy - Third Trimester) El tercer trimestre del embarazo (los ltimos 3 meses) es el perodo en el cual tanto usted como su beb crecen con ms rapidez. El beb alcanza un largo de aproximadamente 50 cm. y pesa entre 2,700 y 4,500 kg. El beb gana ms tejido graso y est listo para la vida fuera del cuerpo de la madre. Mientras estn en el interior, los bebs tienen perodos de sueo y vigilia, succionan el pulgar y tienen hipo. Quizs sienta pequeas contracciones del tero. Este es el falso trabajo de parto. Tambin se las conoce como contracciones de Braxton-Hicks . Es como una prctica del parto. Los problemas ms habituales de esta etapa del embarazo incluyen mayor dificultad para respirar, hinchazn de las manos y los pies por retencin de lquidos y la necesidad de orinar con ms frecuencia debido a que el tero y el beb presionan sobre la vejiga.  EXAMENES PRENATALES   Durante los exmenes prenatales, deber seguir realizndose anlisis de sangre. Estas pruebas se realizan para controlar su salud y la del beb. Los anlisis de sangre se realizan para conocer los niveles de algunos compuestos de la sangre (hemoglobina). La anemia (bajo nivel de hemoglobina) es frecuente durante el embarazo. Para prevenirla, se administran hierro y vitaminas. Tambin le tomarn nuevas anlisis para descartar diabetes. Podrn repetirle algunas de las pruebas que le hicieron previamente.  En cada visita le medirn el tamao del tero. Esto permite asegurar que el beb se desarrolla adecuadamente, segn la fecha del embarazo.  Le controlarn la presin arterial en cada visita prenatal. Esto es para asegurarse de que no sufre toxemia.  Le harn un anlisis de orina en cada visita prenatal, para descartar infecciones, diabetes y la presencia de protenas.  Tambin en cada visita controlarn su peso. Esto se realiza para asegurarse que aumenta de peso al ritmo indicado y que usted y su  beb evolucionan normalmente.  En algunas ocasiones se realiza una prueba de ultrasonido para confirmar el correcto desarrollo y evolucin del beb. Esta prueba se realiza con ondas sonoras inofensivas para el beb, de modo que el profesional pueda calcular ms precisamente la fecha del parto.  Analice con su mdico los analgsicos y la anestesia que recibir durante el trabajo de parto y el parto.  Comente la posibilidad de que necesite una cesrea y qu anestesia se recibir.  Informe a su mdico si sufre violencia familiar mental o fsica. A veces, se indica la prueba especializada sin estrs, la prueba de tolerancia a las contracciones y el perfil biofsico para asegurarse de que el beb no tiene problemas. El estudio del lquido amnitico que rodea al beb se llama amniocentesis. El lquido amnitico se obtiene introduciendo una aguja en el vientre (abdomen ). En ocasiones se lleva a cabo cerca del final del embarazo, si es necesario inducir a un parto. En este caso se realiza para asegurarse que los pulmones del beb estn lo suficientemente maduros como para que pueda vivir fuera del tero. Si los pulmones no han madurado y es peligroso que el beb nazca, se administrar a la madre una inyeccin de cortisona , 1 a 2 das antes del parto. . Esto ayuda a que los pulmones del beb maduren y sea ms seguro su nacimiento.  CAMBIOS QUE OCURREN EN EL TERCER TRIMESTRE DEL EMBARAZO  Su organismo atravesar numerosos cambios durante el embarazo. Estos pueden variar de una persona a otra. Converse con el profesional que la asiste acerca los cambios que   usted note y que la preocupen.   Durante el ltimo trimestre probablemente sienta un aumento del apetito. Es normal tener "antojos" de ciertas comidas. Esto vara de una persona a otra y de un embarazo a otro.  Podrn aparecer las primeras estras en las caderas, abdomen y mamas. Estos son cambios normales del cuerpo durante el embarazo. No existen  medicamentos ni ejercicios que puedan prevenir estos cambios.  La constipacin puede tratarse con un laxante o agregando fibra a su dieta. Beber grandes cantidades de lquidos, tomar fibras en forma de vegetales, frutas y granos integrales es de gran ayuda.  Tambin es beneficioso practicar actividad fsica. Si ha sido una persona activa hasta el embarazo, podr continuar con la mayora de las actividades durante el mismo. Si ha sido menos activa, puede ser beneficioso que comience con un programa de ejercicios, como realizar caminatas. Consulte con el profesional que la asiste antes de comenzar un programa de ejercicios.  Evite el consumo de cigarrillos, el alcohol, los medicamentos no recetados y las "drogas de la calle" durante el embarazo. Estas sustancias qumicas afectan la formacin y el desarrollo del beb. Evite estas sustancias durante todo el embarazo para asegurar el nacimiento de un beb sano.  Podr sentir dolor de espalda, tener vrices en las venas y hemorroides, o si ya los sufra, pueden empeorar.  Durante el tercer trimestre se cansar con ms facilidad, lo cual es normal.  Los movimientos del beb pueden ser ms fuertes y con ms frecuencia.  Puede que note dificultades para respirar normalmente.  El ombligo puede salir hacia afuera.  A veces sale una secrecin amarilla de las mamas, que se llama calostro.  Podr aparecer una secrecin mucosa con sangre. Esto suele ocurrir entre unos pocos das y una semana antes del parto. INSTRUCCIONES PARA EL CUIDADO EN EL HOGAR   Cumpla con las citas de control. Siga las indicaciones del mdico con respecto al uso de medicamentos, los ejercicios y la dieta.  Durante el embarazo debe obtener nutrientes para usted y para su beb. Consuma alimentos balanceados a intervalos regulares. Elija alimentos como carne, pescado, leche y otros productos lcteos descremados, vegetales, frutas, panes integrales y cereales. El mdico le informar  cul es el aumento de peso ideal.  Las relaciones sexuales pueden continuarse hasta casi el final del embarazo, si no se presentan otros problemas como prdida prematura (antes de tiempo) de lquido amnitico, hemorragia vaginal o dolor en el vientre (abdominal).  Realice actividad fsica todos los das, si no tiene restricciones. Consulte con el profesional que la asiste si no sabe con certeza si determinados ejercicios son seguros. El mayor aumento de peso se producir en los ltimos 2 trimestres del embarazo. El ejercicio ayuda a:  Controlar su peso.  Mantenerse en forma para el trabajo de parto y el parto .  Perder peso despus del parto.  Haga reposo con frecuencia, con las piernas elevadas, o segn lo necesite para evitar los calambres y el dolor de cintura.  Use un buen sostn o como los que se usan para hacer deportes para aliviar la sensibilidad de las mamas. Tambin puede serle til si lo usa mientras duerme. Si pierde calostro, podr utilizar apsitos en el sostn.  No utilice la baera con agua caliente, baos turcos y saunas.  Colquese el cinturn de seguridad cuando conduzca. Este la proteger a usted y al beb en caso de accidente.  Evite comer carne cruda y el contacto con los utensilios y desperdicios de los gatos. Estos elementos   contienen grmenes que pueden causar defectos de nacimiento en el beb.  Es fcil perder algo de orina durante el embarazo. Apretar y fortalecer los msculos de la pelvis la ayudar con este problema. Practique detener la miccin cuando est en el bao. Estos son los mismos msculos que necesita fortalecer. Son tambin los mismos msculos que utiliza cuando trata de evitar despedir gases. Puede practicar apretando estos msculos diez veces, y repetir esto tres veces por da aproximadamente. Una vez que conozca qu msculos debe apretar, no realice estos ejercicios durante la miccin. Puede favorecerle una infeccin si la orina vuelve hacia  atrs.  Pida ayuda si tienen necesidades financieras, teraputicas o nutricionales. El profesional podr ayudarla con respecto a estas necesidades, o derivarla a otros especialistas.  Haga una lista de nmeros telefnicos de emergencia y tngalos disponibles.  Planifique como obtener ayuda de familiares o amigos cuando regrese a casa desde el hospital.  Hacer un ensayo sobre la partida al hospital.  Tome clases prenatales con el padre para entender, practicar y hacer preguntas sobre el trabajo de parto y el alumbramiento.  Preparar la habitacin del beb / busque una guardera.  No viaje fuera de la ciudad a menos que sea absolutamente necesario y con el asesoramiento de su mdico.  Use slo zapatos de tacn bajo o sin tacn para tener mejor equilibrio y evitar cadas. USO DE MEDICAMENTOS Y CONSUMO DE DROGAS DURANTE EL EMBARAZO   Tome las vitaminas apropiadas para esta etapa tal como se le indic. Las vitaminas deben contener un miligramo de cido flico. Guarde todas las vitaminas fuera del alcance de los nios. La ingestin de slo un par de vitaminas o tabletas que contengan hierro pueden ocasionar la muerte en un beb o en un nio pequeo.  Evite el uso de todos los medicamentos, incluyendo hierbas, medicamentos de venta libre, sin receta o que no hayan sido sugeridos por su mdico. Slo tome medicamentos de venta libre o medicamentos recetados para el dolor, el malestar o fiebre como lo indique su mdico. No tome aspirina, ibuprofeno (Motrin, Advil, Nuprin) o naproxeno (Aleve) excepto que su mdico se lo indique.  Infrmele al profesional si consume alguna droga.  El alcohol se relaciona con ciertos defectos congnitos. Incluye el sndrome de alcoholismo fetal. Debe evitar absolutamente el consumo de alcohol, en cualquier forma. El fumar produce baja tasa de natalidad y bebs prematuros.  Las drogas ilegales o de la calle son muy perjudiciales para el beb. Estn absolutamente  prohibidas. Un beb que nace de una madre adicta, ser adicto al nacer. Ese beb tendr los mismos sntomas de abstinencia que un adulto. SOLICITE ATENCIN MDICA SI:  Tiene preguntas o preocupaciones relacionadas con el embarazo. Es mejor que llame para formular las preguntas si no puede esperar hasta la prxima visita, que sentirse preocupada por ellas.  DECISIONES ACERCA DE LA CIRCUNCISIN  Usted puede saber o no cul es el sexo de su beb. Si ya sabe que ser un varn, este es el momento de pensar acerca de la circuncisin. La circuncisin es la extirpacin del prepucio. Esta es la piel que cubre el extremo sensible del pene. No hay un motivo mdico que lo justifique. Generalmente la decisin se toma segn lo que sea popular en ese momento, o segn creencias religiosas. Podr conversar estos temas con su mdico o con el pediatra.  SOLICITE ATENCIN MDICA DE INMEDIATO SI:   La temperatura oral le sube a ms de 102 F (38.9 C) o lo que su mdico le   indique.  Tiene una prdida de lquido por la vagina (canal de parto). Si sospecha una ruptura de las [place], tmese la temperatura y llame al profesional para informarlo sobre esto.  Observa unas pequeas manchas, una hemorragia vaginal o elimina cogulos. Notifique al profesional acerca de la cantidad y de cuntos apsitos est utilizando.  Presenta un olor desagradable en la secrecin vaginal y observa un cambio en el color, de transparente a blanco.  Ha vomitado durante ms de 24 horas.  Siente escalofros o le sube la fiebre.  Le falta el aire.  Siente ardor al Beatrix Shipper.  Baja o sube ms de 2 libras (900 g), o segn lo indicado por el profesional que la asiste.  Observa que sbitamente se le hinchan el rostro, las manos, los pies o las piernas.  Siente dolor en el vientre (abdominal). Las [employer] en el ligamento redondo son Neomia Dear causa benigna frecuente de dolor abdominal durante el embarazo. El profesional que la asiste deber  evaluarla.  Presenta dolor de cabeza intenso que no se [place].  Tiene problemas visuales, visin doble o borrosa.  Si no siente los movimientos del beb durante ms de 1 hora. Si piensa que el beb no se mueve tanto como lo haca habitualmente, coma algo que Psychologist, clinical y [employer] lado izquierdo durante [place]. El beb debe moverse al menos 4  5 veces por hora. Comunquese inmediatamente si el beb se mueve menos que lo indicado.  Se cae, se ve involucrada en un accidente automovilstico o sufre algn tipo de traumatismo.  En su hogar hay violencia mental o fsica. Document Released: 01/23/2005 Document Revised: 10/15/2011 [facility] Patient Information 2013 [place], [place]. Mtodo para contar los movimientos fetales (Fetal Movement Counts) Nombre de la paciente: __________________________________________________ Franco Nones probable de parto:____________________ En los embarazos de alto riesgo se recomienda contar las pataditas, pero tambin es una buena idea que lo hagan todas las [place]. Comience a contarlas a las 28 semanas de embarazo. Los movimientos fetales aumentan luego de una comida Immunologist o de comer o beber algo dulce (el nivel de azcar en la sangre est ms alto). Tambin es importante beber gran cantidad de lquidos (hidratarse bien) antes de contar. Si se recuesta sobre el lado izquierdo mejorar la Designer, industrial/product, o puede sentarse en una silla cmoda con los brazos sobre el abdomen y sin distracciones que la rodeen. CONTANDO  Trate de contar a la [employer] lo haga.  Marque el da y la hora y vea cunto le lleva sentir 10 movimientos (patadas, agitaciones, sacudones, vueltas). Debe sentir al menos 10 movimientos en 2 horas. Probablemente sienta los 10 movimientos en menos de dos horas. Si no los siente, espere una hora y cuente nuevamente. Luego de [employer] tendr un patrn.  Debemos observar si hay cambios en el patrn o no hay suficientes  pataditas en 2 horas. Le lleva ms tiempo contar los 10 movimientos? SOLICITE ATENCIN MDICA SI:  Siente menos de 10 pataditas en 2 horas. Intntelo dos veces.  No siente movimientos durante 1 hora.  El patrn se modifica o le lleva ms tiempo Art gallery manager las 10 pataditas.  Siente que el beb no se mueve como lo hace habitualmente. Fecha: ____________ Movimientos: ____________ Comienzo hora: ____________ Cephas Darby: ____________ Franco Nones: ____________ Movimientos: ____________ Comienzo hora: ____________ Cephas Darby: ____________ Franco Nones: ____________ Movimientos: ____________ Comienzo hora: ____________ Cephas Darby: ____________ Franco Nones: ____________ Movimientos: ____________ Comienzo hora: ____________ Cephas Darby: ____________ Franco Nones: ____________ Movimientos: ____________ Comienzo hora: ____________ Daylene Posey  hora: ____________ Franco Nones: ____________ Movimientos: ____________ Comienzo hora: ____________ Cephas Darby: ____________ Franco Nones: ____________ Movimientos: ____________ Comienzo hora: ____________ Cephas Darby: ____________  Franco Nones: ____________ Movimientos: ____________ Comienzo hora: ____________ Cephas Darby: ____________ Franco Nones: ____________ Movimientos: ____________ Comienzo hora: ____________ Cephas Darby: ____________ Franco Nones: ____________ Movimientos: ____________ Comienzo hora: ____________ Cephas Darby: ____________ Franco Nones: ____________ Movimientos: ____________ Comienzo hora: ____________ Cephas Darby: ____________ Franco Nones: ____________ Movimientos: ____________ Comienzo hora: ____________ Cephas Darby: ____________ Franco Nones: ____________ Movimientos: ____________ Comienzo hora: ____________ Cephas Darby: ____________ Franco Nones: ____________ Movimientos: ____________ Comienzo hora: ____________ Cephas Darby: ____________  Franco Nones: ____________ Movimientos: ____________ Comienzo hora: ____________ Cephas Darby: ____________ Franco Nones: ____________ Movimientos: ____________ Comienzo hora: ____________ Cephas Darby: ____________ Franco Nones: ____________  Movimientos: ____________ Comienzo hora: ____________ Cephas Darby: ____________ Franco Nones: ____________ Movimientos: ____________ Comienzo hora: ____________ Cephas Darby: ____________ Franco Nones: ____________ Movimientos: ____________ Comienzo hora: ____________ Cephas Darby: ____________ Franco Nones: ____________ Movimientos: ____________ Comienzo hora: ____________ Cephas Darby: ____________ Franco Nones: ____________ Movimientos: ____________ Comienzo hora: ____________ Cephas Darby: ____________  Franco Nones: ____________ Movimientos: ____________ Comienzo hora: ____________ Cephas Darby: ____________ Franco Nones: ____________ Movimientos: ____________ Comienzo hora: ____________ Cephas Darby: ____________ Franco Nones: ____________ Movimientos: ____________ Comienzo hora: ____________ Cephas Darby: ____________ Franco Nones: ____________ Movimientos: ____________ Comienzo hora: ____________ Cephas Darby: ____________ Franco Nones: ____________ Movimientos: ____________ Comienzo hora: ____________ Cephas Darby: ____________ Franco Nones: ____________ Movimientos: ____________ Comienzo hora: ____________ Cephas Darby: ____________ Franco Nones: ____________ Movimientos: ____________ Comienzo hora: ____________ Cephas Darby: ____________  Franco Nones: ____________ Movimientos: ____________ Comienzo hora: ____________ Cephas Darby: ____________ Franco Nones: ____________ Movimientos: ____________ Comienzo hora: ____________ Cephas Darby: ____________ Franco Nones: ____________ Movimientos: ____________ Comienzo hora: ____________ Cephas Darby: ____________ Franco Nones: ____________ Movimientos: ____________ Comienzo hora: ____________ Cephas Darby: ____________ Franco Nones: ____________ Movimientos: ____________ Comienzo hora: ____________ Cephas Darby: ____________ Franco Nones: ____________ Movimientos: ____________ Comienzo hora: ____________ Cephas Darby: ____________ Franco Nones: ____________ Movimientos: ____________ Comienzo hora: ____________ Cephas Darby: ____________  Franco Nones: ____________ Movimientos: ____________ Comienzo hora: ____________ Cephas Darby: ____________ Franco Nones:  ____________ Movimientos: ____________ Comienzo hora: ____________ Cephas Darby: ____________ Franco Nones: ____________ Movimientos: ____________ Comienzo hora: ____________ Cephas Darby: ____________ Franco Nones: ____________ Movimientos: ____________ Comienzo hora: ____________ Cephas Darby: ____________ Franco Nones: ____________ Movimientos: ____________ Comienzo hora: ____________ Cephas Darby: ____________ Franco Nones: ____________ Movimientos: ____________ Comienzo hora: ____________ Cephas Darby: ____________ Franco Nones: ____________ Movimientos: ____________ Comienzo hora: ____________ Cephas Darby: ____________  Franco Nones: ____________ Movimientos: ____________ Comienzo hora: ____________ Cephas Darby: ____________ Franco Nones: ____________ Movimientos: ____________ Comienzo hora: ____________ Cephas Darby: ____________ Franco Nones: ____________ Movimientos: ____________ Comienzo hora: ____________ Cephas Darby: ____________ Franco Nones: ____________ Movimientos: ____________ Comienzo hora: ____________ Cephas Darby: ____________ Franco Nones: ____________ Movimientos: ____________ Comienzo hora: ____________ Cephas Darby: ____________ Franco Nones: ____________ Movimientos: ____________ Comienzo hora: ____________ Cephas Darby: ____________ Franco Nones: ____________ Movimientos: ____________ Comienzo hora: ____________ Cephas Darby: ____________  Franco Nones: ____________ Movimientos: ____________ Comienzo hora: ____________ Cephas Darby: ____________ Franco Nones: ____________ Movimientos: ____________ Comienzo hora: ____________ Cephas Darby: ____________ Franco Nones: ____________ Movimientos: ____________ Comienzo hora: ____________ Cephas Darby: ____________ Franco Nones: ____________ Movimientos: ____________ Comienzo hora: ____________ Cephas Darby: ____________ Franco Nones: ____________ Movimientos: ____________ Comienzo hora: ____________ Cephas Darby: ____________ Franco Nones: ____________ Movimientos: ____________ Comienzo hora: ____________ Cephas Darby: ____________ Franco Nones: ____________ Movimientos: ____________ Comienzo hora: ____________ Fin hora:  ____________  Document Released: 07/23/2007 Document Revised: 07/08/2011 [employer] Patient Information 2013 [employer].

## 2012-01-29 NOTE — Progress Notes (Signed)
LMP dates c/w 23.4 [place]. Doing well. Seen [facility] yesterday, cx still 1 and NST reactive. Kick counts and return in 1 wk for FATs.

## 2012-02-03 ENCOUNTER — Inpatient Hospital Stay (HOSPITAL_COMMUNITY)
Admission: AD | Admit: 2012-02-03 | Discharge: 2012-02-04 | DRG: 775 | Disposition: A | Discharge status: Home / Self Care | Payer: Medicaid Other | Source: Ambulatory Visit | Attending: Obstetrics & Gynecology | Admitting: Obstetrics & Gynecology

## 2012-02-03 ENCOUNTER — Encounter (HOSPITAL_COMMUNITY): Payer: Self-pay

## 2012-02-03 DIAGNOSIS — Z3480 Encounter for supervision of other normal pregnancy, unspecified trimester: Secondary | ICD-10-CM

## 2012-02-03 LAB — CBC
HCT: 33.7 % — ABNORMAL LOW (ref 36.0–46.0)
MCH: 29.5 pg (ref 26.0–34.0)
MCHC: 33.8 g/dL (ref 30.0–36.0)
MCV: 87.1 fL (ref 78.0–100.0)
RDW: 13.5 % (ref 11.5–15.5)
WBC: 9.1 10*3/uL (ref 4.0–10.5)

## 2012-02-03 LAB — TYPE AND SCREEN
ABO/RH(D): O POS
Antibody Screen: NEGATIVE

## 2012-02-03 MED ORDER — NALBUPHINE SYRINGE 5 MG/0.5 ML
5.0000 mg | INJECTION | INTRAMUSCULAR | Status: DC | PRN
  Filled 2012-02-03: qty 0.5

## 2012-02-03 MED ORDER — SENNOSIDES-DOCUSATE SODIUM 8.6-50 MG PO TABS
2.0000 | ORAL_TABLET | Freq: Every day | ORAL | Status: DC
  Administered 2012-02-03: 2 via ORAL

## 2012-02-03 MED ORDER — DIPHENHYDRAMINE HCL 25 MG PO CAPS: 25.0000 mg | ORAL_CAPSULE | Freq: Four times a day (QID) | ORAL | Status: DC | PRN

## 2012-02-03 MED ORDER — ONDANSETRON HCL 4 MG PO TABS: 4.0000 mg | ORAL_TABLET | ORAL | Status: DC | PRN

## 2012-02-03 MED ORDER — METHYLERGONOVINE MALEATE 0.2 MG PO TABS: 0.2000 mg | ORAL_TABLET | ORAL | Status: DC | PRN

## 2012-02-03 MED ORDER — OXYCODONE-ACETAMINOPHEN 5-325 MG PO TABS: 1.0000 | ORAL_TABLET | ORAL | Status: DC | PRN

## 2012-02-03 MED ORDER — PRENATAL MULTIVITAMIN CH
1.0000 | ORAL_TABLET | Freq: Every day | ORAL | Status: DC
  Administered 2012-02-03 – 2012-02-04 (×2): 1 via ORAL
  Filled 2012-02-03 (×2): qty 1

## 2012-02-03 MED ORDER — SODIUM CHLORIDE 0.9 % IJ SOLN: 3.0000 mL | Freq: Two times a day (BID) | INTRAMUSCULAR | Status: DC

## 2012-02-03 MED ORDER — ZOLPIDEM TARTRATE 5 MG PO TABS: 5.0000 mg | ORAL_TABLET | Freq: Every evening | ORAL | Status: DC | PRN

## 2012-02-03 MED ORDER — TETANUS-DIPHTH-ACELL PERTUSSIS 5-2.5-18.5 LF-MCG/0.5 IM SUSP
0.5000 mL | Freq: Once | INTRAMUSCULAR | Status: AC
  Administered 2012-02-04: 0.5 mL via INTRAMUSCULAR
  Filled 2012-02-03: qty 0.5

## 2012-02-03 MED ORDER — SIMETHICONE 80 MG PO CHEW: 80.0000 mg | CHEWABLE_TABLET | ORAL | Status: DC | PRN

## 2012-02-03 MED ORDER — ACETAMINOPHEN 325 MG PO TABS: 650.0000 mg | ORAL_TABLET | ORAL | Status: DC | PRN

## 2012-02-03 MED ORDER — LACTATED RINGERS IV SOLN: 500.0000 mL | INTRAVENOUS | Status: DC | PRN

## 2012-02-03 MED ORDER — ONDANSETRON HCL 4 MG/2ML IJ SOLN: 4.0000 mg | INTRAMUSCULAR | Status: DC | PRN

## 2012-02-03 MED ORDER — ONDANSETRON HCL 4 MG/2ML IJ SOLN: 4.0000 mg | Freq: Four times a day (QID) | INTRAMUSCULAR | Status: DC | PRN

## 2012-02-03 MED ORDER — OXYTOCIN BOLUS FROM INFUSION
500.0000 mL | Freq: Once | INTRAVENOUS | Status: DC
  Filled 2012-02-03: qty 500

## 2012-02-03 MED ORDER — BENZOCAINE-MENTHOL 20-0.5 % EX AERO
1.0000 "application " | INHALATION_SPRAY | CUTANEOUS | Status: DC | PRN
  Administered 2012-02-04: 1 via TOPICAL
  Filled 2012-02-03: qty 56

## 2012-02-03 MED ORDER — OXYCODONE-ACETAMINOPHEN 5-325 MG PO TABS
1.0000 | ORAL_TABLET | ORAL | Status: DC | PRN
  Administered 2012-02-03: 1 via ORAL
  Filled 2012-02-03: qty 1

## 2012-02-03 MED ORDER — LIDOCAINE HCL (PF) 1 % IJ SOLN
30.0000 mL | INTRAMUSCULAR | Status: DC | PRN
  Filled 2012-02-03: qty 30

## 2012-02-03 MED ORDER — IBUPROFEN 600 MG PO TABS
600.0000 mg | ORAL_TABLET | Freq: Four times a day (QID) | ORAL | Status: DC
  Administered 2012-02-03 – 2012-02-04 (×5): 600 mg via ORAL
  Filled 2012-02-03 (×5): qty 1

## 2012-02-03 MED ORDER — WITCH HAZEL-GLYCERIN EX PADS: 1.0000 "application " | MEDICATED_PAD | CUTANEOUS | Status: DC | PRN

## 2012-02-03 MED ORDER — METHYLERGONOVINE MALEATE 0.2 MG/ML IJ SOLN: 0.2000 mg | INTRAMUSCULAR | Status: DC | PRN

## 2012-02-03 MED ORDER — IBUPROFEN 600 MG PO TABS
600.0000 mg | ORAL_TABLET | Freq: Four times a day (QID) | ORAL | Status: DC | PRN
  Administered 2012-02-03: 600 mg via ORAL
  Filled 2012-02-03: qty 1

## 2012-02-03 MED ORDER — LANOLIN HYDROUS EX OINT: TOPICAL_OINTMENT | CUTANEOUS | Status: DC | PRN

## 2012-02-03 MED ORDER — CITRIC ACID-SODIUM CITRATE 334-500 MG/5ML PO SOLN: 30.0000 mL | ORAL | Status: DC | PRN

## 2012-02-03 MED ORDER — OXYTOCIN 40 UNITS IN LACTATED RINGERS INFUSION - SIMPLE MED
62.5000 mL/h | Freq: Once | INTRAVENOUS | Status: AC
  Administered 2012-02-03: 999 mL/h via INTRAVENOUS
  Filled 2012-02-03: qty 1000

## 2012-02-03 MED ORDER — DIBUCAINE 1 % RE OINT: 1.0000 "application " | TOPICAL_OINTMENT | RECTAL | Status: DC | PRN

## 2012-02-03 NOTE — H&P (Signed)
Connie Boyd is a 26 y.o. female presenting for labor evaluation. Contractions began at 12:30 Maternal Medical History:  Reason for admission: Reason for admission: contractions.  Reason for Admission:   nauseaContractions: Onset was 1-2 hours ago.   Frequency: regular.   Duration is approximately 60 seconds.   Perceived severity is strong.    Fetal activity: Perceived fetal activity is normal.   Last perceived fetal movement was within the past hour.      OB History    Grav Para Term Preterm Abortions TAB SAB Ect Mult Living   4 2 2  1  1   2      Past Medical History  Diagnosis Date  . Anxiety   . Depression    Past Surgical History  Procedure Date  . Appendectomy   . Ovarian cyst removal    Family History: family history includes Cancer in her paternal uncle and Diabetes in her maternal grandfather.  There is no history of Anesthesia problems and Other. Social History:  reports that she has never smoked. She has never used smokeless tobacco. She reports that she drinks about .6 ounces of alcohol per week. She reports that she does not use illicit drugs.   Prenatal Transfer Tool  Maternal Diabetes: No Genetic Screening: Declined Maternal Ultrasounds/Referrals: Normal Fetal Ultrasounds or other Referrals:  None Maternal Substance Abuse:  No Significant Maternal Medications:  None Significant Maternal Lab Results:  None Other Comments:   Review of Systems  Constitutional: Negative for fever and chills.  Eyes: Negative for blurred vision.  Cardiovascular: Negative for chest pain.  Gastrointestinal: Negative for nausea, vomiting, diarrhea and constipation.  Genitourinary: Negative for dysuria and urgency.  Neurological: Negative for dizziness and headaches.    Dilation: 4 Effacement (%): 100 Station: -1 Exam by:: M. Topp RN Blood pressure 137/68, pulse 73, temperature 97.8 F (36.6 C), temperature source Axillary, resp. rate 18, last menstrual period  04/26/2011, SpO2 100.00%. Maternal Exam:  Uterine Assessment: Contraction strength is firm.  Contraction duration is 60 seconds. Contraction frequency is regular.   Abdomen: Patient reports no abdominal tenderness. Fetal presentation: vertex  Introitus: Normal vulva. Pelvis: adequate for delivery.   Cervix: Cervix evaluated by digital exam.     Fetal Exam Fetal Monitor Review: Mode: ultrasound.   Baseline rate: 135.  Variability: moderate (6-25 bpm).   Pattern: no accelerations and no decelerations.    Fetal State Assessment: Category I - tracings are normal.     Physical Exam  Nursing note and vitals reviewed. Constitutional: She is oriented to person, place, and time. She appears well-developed and well-nourished.  Cardiovascular: Normal rate and regular rhythm.   Respiratory: Effort normal and breath sounds normal.  GI: Soft. Bowel sounds are normal.  Genitourinary: Vagina normal.  Neurological: She is alert and oriented to person, place, and time.  Skin: Skin is warm and dry.    Prenatal labs: ABO, Rh:   O positive Antibody:   Negative Rubella: 299.3 (06/12 0859) RPR: NON REAC (06/12 0859)  HBsAg: NEGATIVE (06/12 0859)  HIV: NON REACTIVE (06/12 0859)  GBS: Negative (09/04 0000)   Assessment/Plan: 26 y.o. [id number] @ [redacted]w[redacted]d Active labor Reassuring maternal/fetal status Admit to labor and delivery Anticipate NSVB  Tawnya Crook 02/03/2012, 2:31 AM

## 2012-02-03 NOTE — Progress Notes (Signed)
UR chart review completed.  

## 2012-02-04 LAB — CBC
HCT: 27.1 % — ABNORMAL LOW (ref 36.0–46.0)
MCHC: 33.6 g/dL (ref 30.0–36.0)
Platelets: 175 10*3/uL (ref 150–400)
RDW: 13.8 % (ref 11.5–15.5)
WBC: 9.6 10*3/uL (ref 4.0–10.5)

## 2012-02-04 MED ORDER — LANOLIN HYDROUS EX OINT: 1.0000 "application " | TOPICAL_OINTMENT | CUTANEOUS | Status: DC | PRN

## 2012-02-04 MED ORDER — OXYCODONE-ACETAMINOPHEN 5-325 MG PO TABS: 1.0000 | ORAL_TABLET | Freq: Four times a day (QID) | ORAL | Status: DC | PRN

## 2012-02-04 MED ORDER — BENZOCAINE-MENTHOL 20-0.5 % EX AERO: 1.0000 "application " | INHALATION_SPRAY | CUTANEOUS | Status: DC | PRN

## 2012-02-04 NOTE — Discharge Summary (Signed)
Obstetric Discharge Summary Reason for Admission: onset of labor Prenatal Procedures: ultrasound Intrapartum Procedures: spontaneous vaginal delivery Postpartum Procedures: none Complications-Operative and Postpartum: none Hemoglobin  Date Value Range Status  02/04/2012 9.0* 12.0 - 15.0 g/dL Final     DELTA CHECK NOTED     REPEATED TO VERIFY     HCT  Date Value Range Status  02/04/2012 27.1* 36.0 - 46.0 % Final    Physical Exam:  General: alert and cooperative Lochia: appropriate Uterine Fundus: firm Incision: none DVT Evaluation: No evidence of DVT seen on physical exam. Negative Homan's sign. No cords or calf tenderness.  Discharge Diagnoses: Term Pregnancy-delivered  Discharge Information: Date: 02/04/2012 Activity: unrestricted and pelvic rest Diet: routine Medications: None Condition: stable Instructions: see AVS Discharge to: home Follow-up Information    Schedule an appointment as soon as possible for a visit in 6 weeks to follow up. (Follow up With OB)          Newborn Data: Live born female  Birth Weight: 6 lb 1 oz (2750 g) APGAR: 8, 9  Home with mother.  Felix Pacini 02/04/2012, 6:20 AM  I was present for the exam and agree with above. Add Iiron supplement.   Mor[place], CNM 02/04/2012 11:18 AM

## 2012-02-04 NOTE — Clinical Social Work Maternal (Signed)
    Clinical Social Work Department PSYCHOSOCIAL ASSESSMENT - MATERNAL/CHILD 02/04/2012  Patient:  Connie Boyd, Connie Boyd  Account Number:  [id number]  Admit Date:  02/03/2012  Marjo Bicker Name:    Clinical Social Worker:  Lulu Riding, LCSW   Date/Time:  02/04/2012 10:00 AM  Date Referred:  02/04/2012   Referral source  Central Nursery     Referred reason  Depression/Anxiety   Other referral source:    I:  FAMILY / HOME ENVIRONMENT Child's legal guardian:  PARENT  Guardian - Name Guardian - Age Guardian - Address  [place] [address] [place] [address]  Malcher Felipa Furnace  [address] [place] [address]   Other household support members/support persons Other support:   2 other children in the home  Sister, Stanford Breed    II  PSYCHOSOCIAL DATA Information Source:  Patient Interview  Surveyor, quantity and Community Resources Employment:   Financial resources:  [employer] If Medicaid - County:  [employer] / Grade:   Maternity Care Coordinator / Child Services Coordination / Early Interventions:  Cultural issues impacting care:   Language barrier, Spanish speaking, limited [place]    III  STRENGTHS Strengths  Supportive family/friends  Adequate Resources  Home prepared for Child (including basic supplies)   Strength comment:    IV  RISK FACTORS AND CURRENT PROBLEMS Current Problem:  None   Risk Factor & Current Problem Patient Issue Family Issue Risk Factor / Current Problem Comment   N N     V  SOCIAL WORK ASSESSMENT Sw intern and Lulu Riding, LCSW visited with patient in room accompanied by Spanish interpreter. Interpreter explained the reason social workers came. Mob states she has everything needed to care for the baby. 2 other children, school age and toddler, in the home as well as FOB. FOB; Malcher Felipa Furnace and sister Stanford Breed named as primary supports. Sw intern asked about any previous depression or anxiety, MOB denies and  states she has never had with any pregnancy either.  She has no questions or concerns.  Assessment and documentation by Leandro Reasoner, MSW Intern.  Signed off by Lulu Riding, LCSW.      VI SOCIAL WORK PLAN Social Work Plan  No Further Intervention Required / No Barriers to Discharge   Type of pt/family education:   If child protective services report - county:   If child protective services report - date:   Information/referral to community resources comment:   No needs identified.   Other social work plan:

## 2012-02-05 ENCOUNTER — Other Ambulatory Visit: Payer: Self-pay

## 2012-03-02 ENCOUNTER — Encounter: Payer: Self-pay | Admitting: Obstetrics & Gynecology

## 2012-03-02 ENCOUNTER — Ambulatory Visit (INDEPENDENT_AMBULATORY_CARE_PROVIDER_SITE_OTHER): Payer: Self-pay | Admitting: Obstetrics & Gynecology

## 2012-03-02 NOTE — Progress Notes (Unsigned)
Patient ID: Connie Boyd, female   DOB: Jun 11, 1985, 26 y.o.   MRN: [id number] Subjective:     Connie Boyd is a 26 y.o. female who presents for a postpartum visit. She is 1 month postpartum following a spontaneous vaginal delivery. I have fully reviewed the prenatal and intrapartum course. The delivery was at 40.3 gestational weeks. Outcome: spontaneous vaginal delivery. Anesthesia: epidural. Postpartum course has been normal except anemia. Baby's course has been normal. Baby is feeding by breast. Bleeding no bleeding. Bowel function is normal. Bladder function is normal. Patient is not sexually active. Contraception method is abstinence. Postpartum depression screening: negative.  The following portions of the patient's history were reviewed and updated as appropriate: allergies, current medications, past family history, past medical history, past social history, past surgical history and problem list.  Review of Systems Pertinent items are noted in HPI.   Objective:    BP 103/65  Pulse 78  Temp 98.5 F (36.9 C) (Oral)  Ht 4\' 11"  (1.499 m)  Wt 129 lb 14.4 oz (58.922 kg)  BMI 26.24 kg/m2  Breastfeeding? Yes  General:  alert, cooperative and no distress   Breasts:  deferred  Lungs: normal effort  Heart:  regular rate and rhythm  Abdomen: soft, non-tender; bowel sounds normal; no masses,  no organomegaly   Vulva:  normal  Vagina: normal vagina  Cervix:  no lesions  Corpus: normal  Adnexa:  normal adnexa  Rectal Exam: Not performed.        Assessment:    1 month postpartum exam. Pap smear not done at today's visit.   Plan:    1. Contraception: undecided 2. Continue nursing 3. Follow up in: 7 months or as needed.   ARNOLD,JAMES 03/02/2012 4:22 PM

## 2012-03-02 NOTE — Patient Instructions (Signed)
Eleccin del mtodo anticonceptivo  (Contraception Choices) El control de la natalidad (contracecin) impide que el embarazo ocurra. Los diferentes tipos de anticonceptivos funcionan de diferentes maneras. Algunos pueden:   Hacer que el moco del cuello del tero se espese. Esto les dificulta a los espermatozoides llegar al tero.  Hacer ms delgado el el revestimiento del tero. Esto dificulta que el vulo se adhiera a la pared del tero.  Impedir que los ovarios liberen un vulo.  Impedir que el esperma llegue al vulo. Con ciertos tipos de ciruga se puede impedir que el embarazo ocurra. En las mujeres, una ciruga cierra las trompas de Falopio (ligadura de trompas). En los hombres, la ciruga impide que los espermatozoides se liberen durante el acto sexual (vasectoma).  ANTICONCEPTIVOS HORMONALES  Los anticonceptivos hormonales impiden el embarazo, agregando hormonas al organismo. Los tipos de anticonceptivos son:  Un pequeo tubo colocado bajo la piel de la parte superior del brazo (implante). El tubo puede permanecer en el lugar durante 3 aos.  Vacunas administradas cada 3 meses.  Pastillas que se toman todos los das o una vez despus de tener sexo (relaciones sexuales).  Parches que se cambian una vez por semana.  Un anillo que se coloca en la vagina (anillos vaginales). El anillo se deja en su lugar durante 3 semanas y se retira durante 1 semana Luego se coloca un nuevo anillo. ANTICONCEPTIVOS DE BARRERA  Los anticonceptivos de barrera impiden que los espermatozoides lleguen al vulo. Estos tipos de anticonceptivos son:   Una cubierta delgada que se usa sobe el pene (condn masculino) que se coloca durante las relaciones sexuales.  Una cubierta blanda y suelta que se coloca en la vagina (condn femenino) antes de las relaciones sexuales.  Un cuenco de goma que se aplica sobre el cuello del tero (diafragma). Este cuenco debe hacerse para usted. Se coloca en la vagina antes de  tener relaciones sexuales. Debe dejar el diafragma colocado en la vagina durante 6 a 8 horas despus de las relaciones sexuales.  Un capuchn pequeo y suave que se fijo sobre el cuello del tero (capuchn cervical). Este capuchn debe hacerse para usted. Debe dejarlo colocado en la vagina durante 48 horas despus de las relaciones sexuales.  Una esponja que se coloca en la vagina antes de tener relaciones sexuales.  Una sustancia qumica que destruye o impide que los espermatozoides ingresen al cuello y al tero (espermicida). La sustancia qumica puede ser en crema, gel, espuma o pldoras. DISPOSITIVO DE CONTROL INTRAUTERINO (DIU)  El DIU es un pequeo dispositivo plstico en forma de T. Se coloca dentro del tero. Hay dos tipos de DIU:   DIU de cobre El dispositivo est cubierto en alambre de cobre. El cobre produce un lquido que destruye los espermatozoides. Puede permanecer colocado durante 10 aos.  DIU hormonal La hormona impide que ocurra el embarazo. Puede permanecer colocado durante 5 aos. CONTROL DE LA NATALIDAD POR PLANIFICACIN FAMILIAR NATURAL  La planificacin familiar natural significa no tener relaciones sexuales o usar un mtodo anticonceptivo de barrera en los perodos frtiles de la mujer. Una mujer puede:   Usar un calendario para saber cul es su perodo frtil.  Emplear un termmetro para medir la temperatura corporal. Protjase de las enfermedades de transmisin sexual cualquiera sea el mtodo que utilice. Hable con su mdico acerca de cul es el mejor mtodo anticonceptivo para usted.  Document Released: 07/31/2010 Document Revised: 07/08/2011 Exit[employer] Patient Information 2013 [employer].  

## 2014-02-28 ENCOUNTER — Encounter: Payer: Self-pay | Admitting: Obstetrics & Gynecology

## 2016-02-12 ENCOUNTER — Emergency Department (HOSPITAL_COMMUNITY): Payer: No Typology Code available for payment source

## 2016-02-12 ENCOUNTER — Emergency Department (HOSPITAL_COMMUNITY)
Admission: EM | Admit: 2016-02-12 | Discharge: 2016-02-12 | Disposition: A | Discharge status: Home / Self Care | Payer: No Typology Code available for payment source | Attending: Emergency Medicine | Admitting: Emergency Medicine

## 2016-02-12 ENCOUNTER — Encounter (HOSPITAL_COMMUNITY): Payer: Self-pay | Admitting: Emergency Medicine

## 2016-02-12 DIAGNOSIS — S2242XA Multiple fractures of ribs, left side, initial encounter for closed fracture: Secondary | ICD-10-CM | POA: Insufficient documentation

## 2016-02-12 DIAGNOSIS — Y939 Activity, unspecified: Secondary | ICD-10-CM | POA: Diagnosis not present

## 2016-02-12 DIAGNOSIS — S299XXA Unspecified injury of thorax, initial encounter: Secondary | ICD-10-CM | POA: Diagnosis present

## 2016-02-12 DIAGNOSIS — Y999 Unspecified external cause status: Secondary | ICD-10-CM | POA: Diagnosis not present

## 2016-02-12 DIAGNOSIS — Z79899 Other long term (current) drug therapy: Secondary | ICD-10-CM | POA: Insufficient documentation

## 2016-02-12 DIAGNOSIS — Y92410 Unspecified street and highway as the place of occurrence of the external cause: Secondary | ICD-10-CM | POA: Insufficient documentation

## 2016-02-12 LAB — I-STAT BETA HCG BLOOD, ED (MC, WL, AP ONLY): I-stat hCG, quantitative: <5 m[IU]/mL (ref <5)

## 2016-02-12 MED ORDER — HYDROCODONE-ACETAMINOPHEN 5-325 MG PO TABS
1.0000 | ORAL_TABLET | ORAL | 0 refills | Status: DC | PRN
Start: 2016-02-12 — End: 2016-05-24

## 2016-02-12 MED ORDER — KETOROLAC TROMETHAMINE 15 MG/ML IJ SOLN
30.0000 mg | Freq: Once | INTRAMUSCULAR | Status: AC
  Administered 2016-02-12: 30 mg via INTRAMUSCULAR
  Filled 2016-02-12: qty 2

## 2016-02-12 NOTE — ED Notes (Signed)
Pt returned from xray
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
## 2016-02-12 NOTE — ED Notes (Signed)
Patient transported to X-ray 

## 2016-02-12 NOTE — Discharge Instructions (Signed)
Please use pain medication as needed for discomfort. Please use incentive spirometer as directed. Please follow-up with her primary care provider in 3 days for reevaluation. Please return to emergency room immediately. Parents any new or worsening signs or symptoms.
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
## 2016-02-12 NOTE — ED Notes (Signed)
Bed: WA12 Expected date:  Expected time:  Means of arrival:  Comments: tr2 

## 2016-05-24 ENCOUNTER — Encounter (HOSPITAL_COMMUNITY): Payer: Self-pay | Admitting: *Deleted

## 2016-05-24 ENCOUNTER — Inpatient Hospital Stay (HOSPITAL_COMMUNITY)
Admission: AD | Admit: 2016-05-24 | Discharge: 2016-05-24 | Disposition: A | Discharge status: Home / Self Care | Payer: Medicaid Other | Source: Ambulatory Visit | Attending: Obstetrics & Gynecology | Admitting: Obstetrics & Gynecology

## 2016-05-24 DIAGNOSIS — Z3202 Encounter for pregnancy test, result negative: Secondary | ICD-10-CM | POA: Insufficient documentation

## 2016-05-24 DIAGNOSIS — N898 Other specified noninflammatory disorders of vagina: Secondary | ICD-10-CM | POA: Insufficient documentation

## 2016-05-24 DIAGNOSIS — R102 Pelvic and perineal pain: Secondary | ICD-10-CM | POA: Insufficient documentation

## 2016-05-24 DIAGNOSIS — N940 Mittelschmerz: Secondary | ICD-10-CM

## 2016-05-24 LAB — URINALYSIS, ROUTINE W REFLEX MICROSCOPIC
BILIRUBIN URINE: NEGATIVE
GLUCOSE, UA: NEGATIVE mg/dL
HGB URINE DIPSTICK: NEGATIVE
Ketones, ur: NEGATIVE mg/dL
Leukocytes, UA: NEGATIVE
Nitrite: NEGATIVE
PH: 6 (ref 5.0–8.0)
Protein, ur: NEGATIVE mg/dL
SPECIFIC GRAVITY, URINE: 1.003 — AB (ref 1.005–1.030)

## 2016-05-24 LAB — CBC
HCT: 36.4 % (ref 36.0–46.0)
HEMOGLOBIN: 12.3 g/dL (ref 12.0–15.0)
MCH: 28.2 pg (ref 26.0–34.0)
MCHC: 33.8 g/dL (ref 30.0–36.0)
MCV: 83.5 fL (ref 78.0–100.0)
PLATELETS: 203 10*3/uL (ref 150–400)
RBC: 4.36 MIL/uL (ref 3.87–5.11)
RDW: 13.6 % (ref 11.5–15.5)
WBC: 5.9 10*3/uL (ref 4.0–10.5)

## 2016-05-24 LAB — WET PREP, GENITAL
CLUE CELLS WET PREP: NONE SEEN
SPERM: NONE SEEN
Trich, Wet Prep: NONE SEEN
Yeast Wet Prep HPF POC: NONE SEEN

## 2016-05-24 LAB — POCT PREGNANCY, URINE: Preg Test, Ur: NEGATIVE

## 2016-05-24 LAB — HCG, QUANTITATIVE, PREGNANCY: HCG, BETA CHAIN, QUANT, S: 2 m[IU]/mL (ref <5)

## 2016-05-24 NOTE — Discharge Instructions (Signed)
Dolor plvico en la mujer (Pelvic Pain, Female) El dolor plvico se percibe en la parte baja del abdomen, por debajo del ombligo y entre las caderas. El dolor puede comenzar de forma repentina (agudo), reaparecer (recurrente) o durar mucho tiempo (crnico). Se considera que el dolor plvico que dura ms de seis meses es crnico. El dolor plvico puede tener numerosas causas. A veces, la causa no se conoce. CUIDADOS EN EL HOGAR  Tome los medicamentos de venta libre y los recetados solamente como se lo haya indicado el mdico.  Haga reposo como se lo haya indicado el mdico.  No tenga relaciones sexuales si le causan dolor.  Lleve un registro del dolor plvico. Escriba los siguientes datos:  Cundo comenz el dolor.  La ubicacin del dolor.  Qu cosas parecen aliviar o intensificar el dolor, como los alimentos o la menstruacin.  Los sntomas que tiene junto con el dolor.  Concurra a todas las visitas de control como se lo haya indicado el mdico. Esto es importante. SOLICITE AYUDA SI:  Los medicamentos no le alivian el dolor.  El dolor reaparece.  Aparecen nuevos sntomas.  Tiene secrecin o sangrado vaginal atpico.  Tiene fiebre o siente escalofros.  Tiene dificultad para defecar (estreimiento).  Observa sangre en la orina o en la materia fecal.  La orina tiene mal olor.  Se siente mareada o dbil. SOLICITE AYUDA DE INMEDIATO SI:  Siente un dolor repentino que es muy intenso.  El dolor contina empeorando.  El dolor es muy intenso y, adems, tiene alguno de los siguientes sntomas:  Fiebre.  Ganas de vomitar (nuseas).  Vmitos.  Suda mucho.  Se desmaya (pierde el conocimiento). Esta informacin no tiene como fin reemplazar el consejo del mdico. Asegrese de hacerle al mdico cualquier pregunta que tenga. Document Released: 10/15/2011 Document Revised: 05/06/2014 Document Reviewed: 02/03/2015 Elsevier Interactive Patient Education  2017 [employer]  

## 2016-05-24 NOTE — MAU Provider Note (Signed)
History     CSN: [id number]  Arrival date and time: 05/24/16 1043   First Provider Initiated Contact with Patient 05/24/16 1234    Interpreter present for encounter  Non-pregnant female here with left lower abdominal pain. She describes as intermittent and constant at times and pressure and stabbing. Rates pain 6/10. She took Tylenol and had relief until the med wore off. She also c/o vaginal burning. She reports "something hot in there". Symptoms started 1 week ago. She denies VB and reports clear non-odorous discharge. No pain with IC. No new sexual partner in 12 years. She reports recent miscarriage in December. She had positive HPT on 04/15/16 then started having heavy vaginal bleeding and clots on 04/25/16. She reports a fall the day prior.    Pertinent Gynecological History: Contraception: none Sexually transmitted diseases: no past history  Past Medical History:  Diagnosis Date  . Anxiety   . Depression     Past Surgical History:  Procedure Laterality Date  . APPENDECTOMY    . OVARIAN CYST REMOVAL      Family History  Problem Relation Age of Onset  . Cancer Paternal Uncle   . Diabetes Maternal Grandfather   . Anesthesia problems Neg Hx   . Other Neg Hx     Social History  Substance Use Topics  . Smoking status: Never Smoker  . Smokeless tobacco: Never Used  . Alcohol use 0.6 oz/week    1 Cans of beer per week    Allergies: No Known Allergies  Prescriptions Prior to Admission  Medication Sig Dispense Refill Last Dose  . acetaminophen (TYLENOL) 325 MG tablet Take 325 mg by mouth daily as needed for mild pain.   05/23/2016 at Unknown time  . benzocaine-resorcinol (VAGISIL) 5-2 % vaginal cream Place 1 application vaginally at bedtime.   05/23/2016 at Unknown time  . FOLIC ACID PO Take 1 tablet by mouth daily.   05/23/2016 at Unknown time    Review of Systems  Constitutional: Positive for chills. Negative for fever.  Gastrointestinal: Positive for abdominal  pain. Negative for constipation, diarrhea, nausea and vomiting.  Genitourinary: Positive for pelvic pain, vaginal discharge and vaginal pain. Negative for dysuria, frequency, hematuria, urgency and vaginal bleeding.   Physical Exam   Blood pressure 116/62, pulse 64, temperature 98 F (36.7 C), resp. rate 18, height 5' (1.524 m), weight 63.5 kg (140 lb), last menstrual period 03/12/2016, SpO2 99 %, currently breastfeeding.  Physical Exam  Nursing note and vitals reviewed. Constitutional: She is oriented to person, place, and time. She appears well-developed and well-nourished. No distress.  HENT:  Head: Normocephalic and atraumatic.  Neck: Normal range of motion.  Cardiovascular: Normal rate.   Respiratory: Effort normal.  GI: Soft. She exhibits no distension and no mass. There is tenderness (mild) in the right lower quadrant and left lower quadrant. There is no rebound and no guarding.  Genitourinary:  Genitourinary Comments: External: no lesions or erythema Vagina: rugated, parous, thin white discharge Uterus: non enlarged, anteverted, non tender, no CMT Adnexae: no masses, + tenderness left, + tenderness right   Musculoskeletal: Normal range of motion.  Neurological: She is alert and oriented to person, place, and time.  Skin: Skin is warm and dry.  Psychiatric: She has a normal mood and affect.   Results for orders placed or performed during the hospital encounter of 05/24/16 (from the past 24 hour(s))  Urinalysis, Routine w reflex microscopic     Status: Abnormal   Collection Time: 05/24/16 11:30  AM  Result Value Ref Range   Color, Urine STRAW (A) YELLOW   APPearance CLEAR CLEAR   Specific Gravity, Urine 1.003 (L) 1.005 - 1.030   pH 6.0 5.0 - 8.0   Glucose, UA NEGATIVE NEGATIVE mg/dL   Hgb urine dipstick NEGATIVE NEGATIVE   Bilirubin Urine NEGATIVE NEGATIVE   Ketones, ur NEGATIVE NEGATIVE mg/dL   Protein, ur NEGATIVE NEGATIVE mg/dL   Nitrite NEGATIVE NEGATIVE    Leukocytes, UA NEGATIVE NEGATIVE  Pregnancy, urine POC     Status: None   Collection Time: 05/24/16 11:35 AM  Result Value Ref Range   Preg Test, Ur NEGATIVE NEGATIVE  Wet prep, genital     Status: Abnormal   Collection Time: 05/24/16  1:00 PM  Result Value Ref Range   Yeast Wet Prep HPF POC NONE SEEN NONE SEEN   Trich, Wet Prep NONE SEEN NONE SEEN   Clue Cells Wet Prep HPF POC NONE SEEN NONE SEEN   WBC, Wet Prep HPF POC FEW (A) NONE SEEN   Sperm NONE SEEN   CBC     Status: None   Collection Time: 05/24/16  1:17 PM  Result Value Ref Range   WBC 5.9 4.0 - 10.5 K/uL   RBC 4.36 3.87 - 5.11 MIL/uL   Hemoglobin 12.3 12.0 - 15.0 g/dL   HCT 78.2 95.6 - 21.3 %   MCV 83.5 78.0 - 100.0 fL   MCH 28.2 26.0 - 34.0 pg   MCHC 33.8 30.0 - 36.0 g/dL   RDW 08.6 57.8 - 46.9 %   Platelets 203 150 - 400 K/uL  hCG, quantitative, pregnancy     Status: None   Collection Time: 05/24/16  1:17 PM  Result Value Ref Range   hCG, Beta Chain, Quant, S 2 <5 mIU/mL    MAU Course  Procedures  MDM Labs ordered and reviewed. No evidence of pregnancy, retained POCs, UTI, or acute abdominal or pelvic process. Pain has possible ovulatory etiology. Pt does not desire another pregnancy. Recommend condoms for now and see PCP for more effective contraception if desires. Ibuprofen, heating pad, warm bath prn pain. Stable for discharge home.  Assessment and Plan   1. Pelvic pain   2. Negative pregnancy test   3. Ovulatory pain    Discharge home Follow up with PCP as needed Return for worsening sx Ibuprofen prn  Allergies as of 05/24/2016   No Known Allergies     Medication List    STOP taking these medications   benzocaine-resorcinol 5-2 % vaginal cream Commonly known as:  VAGISIL     TAKE these medications   acetaminophen 325 MG tablet Commonly known as:  TYLENOL Take 325 mg by mouth daily as needed for mild pain.   FOLIC ACID PO Take 1 tablet by mouth daily.      Donette Larry,  CNM 05/24/2016, 12:35 PM

## 2016-05-24 NOTE — MAU Note (Signed)
Pt presents to MAU with complaints of abdominal pain since December the 28th.  Pt denies any vaginal bleeding or abnormal discharge. + pregnancy test in December, pt had 4 days of bleeding and thinks she may have had a miscarriage. PT had a fall on Dec 27th and had the bleeding at that time

## 2016-05-28 LAB — GC/CHLAMYDIA PROBE AMP (~~LOC~~) NOT AT ARMC
Chlamydia: NEGATIVE
Neisseria Gonorrhea: NEGATIVE

## 2018-04-29 NOTE — L&D Delivery Note (Addendum)
Patient: Connie Boyd MRN: [id number]  GBS status: Negative  Patient is a 33 y.o. now [id number] s/p NSVD at [redacted]w[redacted]d, who was admitted for IOL secondary to [place]. AROM 2h 6m prior to delivery with clear fluid.   Delivery Note At 6:08 PM a viable female was delivered via Vaginal, Spontaneous (Presentation: ROA).  APGAR: 9/9; weight pending.   Placenta status: Spontaneous, intact.  Cord: 3 vessels.  Anesthesia: None Episiotomy: None Lacerations: None Suture Repair: None Est. Blood Loss (mL): 543 mL  Head delivered ROA. Nuchal cord present x1, which was reduced at perineum. Shoulder and body delivered in usual fashion. Infant with spontaneous cry, placed on mother's abdomen, dried and bulb suctioned. Cord clamped x 2 after 1-minute delay, and cut by family member. Cord blood drawn. Placenta delivered spontaneously with gentle cord traction. Fundus firm with massage and Pitocin. Perineum inspected and found to have no lacerations.  Mom to postpartum.  Baby to Couplet care / Skin to Skin.  Genia Del 11/09/2018, 6:22 PM  OB FELLOW DELIVERY ATTESTATION  I was gloved and present for the delivery in its entirety, and I agree with the above resident's note.    Phill Myron, D.O. OB Fellow  11/10/2018, 9:03 AM

## 2018-06-01 ENCOUNTER — Encounter: Payer: Self-pay | Admitting: Advanced Practice Midwife

## 2018-06-01 ENCOUNTER — Telehealth: Payer: Self-pay

## 2018-06-01 ENCOUNTER — Ambulatory Visit (INDEPENDENT_AMBULATORY_CARE_PROVIDER_SITE_OTHER): Payer: Self-pay | Admitting: Advanced Practice Midwife

## 2018-06-01 ENCOUNTER — Ambulatory Visit: Payer: Self-pay | Admitting: Clinical

## 2018-06-01 VITALS — BP 120/59 | HR 74 | Wt 153.6 lb

## 2018-06-01 DIAGNOSIS — Z124 Encounter for screening for malignant neoplasm of cervix: Secondary | ICD-10-CM

## 2018-06-01 DIAGNOSIS — Z3480 Encounter for supervision of other normal pregnancy, unspecified trimester: Secondary | ICD-10-CM

## 2018-06-01 DIAGNOSIS — Z113 Encounter for screening for infections with a predominantly sexual mode of transmission: Secondary | ICD-10-CM

## 2018-06-01 DIAGNOSIS — Z1151 Encounter for screening for human papillomavirus (HPV): Secondary | ICD-10-CM

## 2018-06-01 DIAGNOSIS — Z23 Encounter for immunization: Secondary | ICD-10-CM

## 2018-06-01 DIAGNOSIS — O0990 Supervision of high risk pregnancy, unspecified, unspecified trimester: Secondary | ICD-10-CM | POA: Insufficient documentation

## 2018-06-01 DIAGNOSIS — Z3482 Encounter for supervision of other normal pregnancy, second trimester: Secondary | ICD-10-CM

## 2018-06-01 NOTE — Telephone Encounter (Signed)
Called pt using [employer] id # [phone] to advise of [place] appt with [facility]. On 07/07/18 @ 12:30p. No answer, Left VM.

## 2018-06-01 NOTE — Progress Notes (Signed)
  Subjective:    Connie Boyd is being seen today for her first obstetrical visit.  She is at [redacted]w[redacted]d gestation. Her obstetrical history is significant for none.  Patient does intend to breast feed. Pregnancy history fully reviewed.  Patient reports no complaints.  Review of Systems:   Review of Systems  All other systems reviewed and are negative.   Objective:     BP (!) 120/59   Pulse 74   Wt 153 lb 9.6 oz (69.7 kg)   LMP 02/23/2018   BMI 30.00 kg/m  Physical Exam  Nursing note and vitals reviewed. Constitutional: She is oriented to person, place, and time. She appears well-developed and well-nourished. No distress.  HENT:  Head: Normocephalic.  Cardiovascular: Normal rate.  Respiratory: Effort normal.  GI: Soft. There is no abdominal tenderness. There is no rebound.  Genitourinary:    Vulva normal.     Genitourinary Comments:  External: no lesion Vagina: small amount of white discharge Cervix: pink, smooth, no CMT Uterus: AGA Adnexa: NT    Neurological: She is alert and oriented to person, place, and time.  Skin: Skin is warm and dry.  Psychiatric: She has a normal mood and affect.    Maternal Exam:  Abdomen: Patient reports no abdominal tenderness. Introitus: Normal vulva. Normal vagina.    Fetal Exam Fetal Monitor Review: Mode: hand-held doppler probe.   Baseline rate: 153.         Assessment:    Pregnancy: P6P9509 Patient Active Problem List   Diagnosis Date Noted  . Supervision of other normal pregnancy, antepartum 06/01/2018  . Depression 11/20/2011       Plan:      Orders Placed This Encounter  Procedures  . Culture, OB Urine  . Flu Vaccine QUAD 36+ mos IM  . Obstetric Panel, Including HIV  . Inheritest(R) CF/SMA Panel   Pap with GC/CT collected today   Initial labs drawn. Prenatal vitamins. Problem list reviewed and updated. AFP3 discussed: undecided. Role of ultrasound in pregnancy discussed; fetal survey: ordered at  the health deparment. . Amniocentesis discussed: not indicated. Follow up in 4 weeks.    Thressa Sheller DNP, CNM  06/01/18  3:32 PM

## 2018-06-01 NOTE — Progress Notes (Signed)
Pt has [place] scheduled at the Health Dept. On 07/07/18 @ 12:30pm.

## 2018-06-01 NOTE — BH Specialist Note (Signed)
Integrated Behavioral Health Initial Visit  MRN: [id number] Name: Connie Boyd  Number of Integrated Behavioral Health Clinician visits:: 1/6 Session Start time: 2:41  Session End time: 2:51 Total time: 15 minutes  Type of Service: Integrated Behavioral Health- Individual/Family Interpretor:Yes.   Interpretor Name and Language: Spanish   Warm Hand Off Completed.       SUBJECTIVE: Connie Boyd is a 33 y.o. female accompanied by n/a Patient was referred by Thressa Sheller, CNM for Initial OB introduction to integrated behavioral health services . Patient reports the following symptoms/concerns: Pt states no particular concern today, other than wanting information about the new hospital.  Duration of problem: Current pregnancy; Severity of problem: mild  OBJECTIVE: Mood: Normal and Affect: Appropriate Risk of harm to self or others: No plan to harm self or others  LIFE CONTEXT: Family and Social: - School/Work: - Self-Care: - Life Changes: Current pregnancy  GOALS ADDRESSED: Patient will: 1. Increase knowledge and/or ability of: healthy habits   INTERVENTIONS: Interventions utilized: Psychoeducation and/or Health Education  Standardized Assessments completed: GAD-7 and PHQ 9  ASSESSMENT: Patient currently experiencing Supervision of other normal pregnancy, antepartum .   Patient may benefit from Initial OB introduction to integrated behavioral health services .  PLAN: 1. Follow up with behavioral health clinician on : As needed 2. Behavioral recommendations:  -Continue taking prenatal vitamin, as recommended by medical provider 3. Referral(s): Integrated [employer] (In Clinic) 4. "From scale of 1-10, how likely are you to follow plan?": 10  Rae Lips, LCSW  Depression screen [facility] 2/9 06/01/2018  Decreased Interest 0  Down, Depressed, Hopeless 0  PHQ - 2 Score 0  Altered sleeping 0  Tired, decreased energy 0  Change in  appetite 0  Feeling bad or failure about yourself  0  Trouble concentrating 0  Moving slowly or fidgety/restless 0  Suicidal thoughts 0  PHQ-9 Score 0   GAD 7 : Generalized Anxiety Score 06/01/2018  Nervous, Anxious, on Edge 0  Control/stop worrying 0  Worry too much - different things 0  Trouble relaxing 0  Restless 0  Easily annoyed or irritable 0  Afraid - awful might happen 0  Total GAD 7 Score 0

## 2018-06-03 LAB — URINE CULTURE, OB REFLEX

## 2018-06-03 LAB — CULTURE, OB URINE

## 2018-06-04 LAB — CYTOLOGY - PAP
CHLAMYDIA, DNA PROBE: NEGATIVE
Diagnosis: NEGATIVE
HPV (WINDOPATH): NOT DETECTED
NEISSERIA GONORRHEA: NEGATIVE

## 2018-06-10 LAB — OBSTETRIC PANEL, INCLUDING HIV
Antibody Screen: NEGATIVE
BASOS: 1 %
Basophils Absolute: 0 10*3/uL (ref 0.0–0.2)
EOS (ABSOLUTE): 0.1 10*3/uL (ref 0.0–0.4)
Eos: 1 %
HEMATOCRIT: 33.7 % — AB (ref 34.0–46.6)
HIV Screen 4th Generation wRfx: NONREACTIVE
Hemoglobin: 11.7 g/dL (ref 11.1–15.9)
Hepatitis B Surface Ag: NEGATIVE
IMMATURE GRANS (ABS): 0.1 10*3/uL (ref 0.0–0.1)
IMMATURE GRANULOCYTES: 1 %
LYMPHS: 16 %
Lymphocytes Absolute: 1.3 10*3/uL (ref 0.7–3.1)
MCH: 31.6 pg (ref 26.6–33.0)
MCHC: 34.7 g/dL (ref 31.5–35.7)
MCV: 91 fL (ref 79–97)
Monocytes Absolute: 0.5 10*3/uL (ref 0.1–0.9)
Monocytes: 5 %
NEUTROS PCT: 76 %
Neutrophils Absolute: 6.5 10*3/uL (ref 1.4–7.0)
Platelets: 174 10*3/uL (ref 150–450)
RBC: 3.7 x10E6/uL — AB (ref 3.77–5.28)
RDW: 13.7 % (ref 11.7–15.4)
RPR Ser Ql: NONREACTIVE
RUBELLA: 6.18 {index} (ref >0.99)
Rh Factor: POSITIVE
WBC: 8.5 10*3/uL (ref 3.4–10.8)

## 2018-06-10 LAB — INHERITEST(R) CF/SMA PANEL

## 2018-06-29 ENCOUNTER — Encounter: Payer: Self-pay | Admitting: Advanced Practice Midwife

## 2018-06-29 ENCOUNTER — Ambulatory Visit (INDEPENDENT_AMBULATORY_CARE_PROVIDER_SITE_OTHER): Payer: Self-pay | Admitting: Advanced Practice Midwife

## 2018-06-29 VITALS — BP 97/82 | HR 75 | Wt 159.9 lb

## 2018-06-29 DIAGNOSIS — Z3480 Encounter for supervision of other normal pregnancy, unspecified trimester: Secondary | ICD-10-CM

## 2018-06-29 DIAGNOSIS — Z3A18 18 weeks gestation of pregnancy: Secondary | ICD-10-CM

## 2018-06-29 DIAGNOSIS — Z3482 Encounter for supervision of other normal pregnancy, second trimester: Secondary | ICD-10-CM

## 2018-06-29 MED ORDER — MAGNESIUM 200 MG PO TABS: 200.0000 mg | ORAL_TABLET | Freq: Every day | ORAL | 3 refills | Status: DC

## 2018-06-29 NOTE — Progress Notes (Signed)
Insomnia, tingling and numbness in hands at night.

## 2018-06-29 NOTE — Progress Notes (Signed)
   PRENATAL VISIT NOTE  Subjective:  Connie Boyd is a 33 y.o. [phone] at [redacted]w[redacted]d being seen today for ongoing prenatal care.  She is currently monitored for the following issues for this low-risk pregnancy and has Depression and Supervision of other normal pregnancy, antepartum on their problem list.  Patient reports carpal tunnel symptoms and and insomnia.  Contractions: Not present. Vag. Bleeding: None.  Movement: Present. Denies leaking of fluid. When questioned about her dates, she said she missed two periods entirely and then had 2 days of light bleeding which is being used as her LMP. Her last normal period was in July or August.  The following portions of the patient's history were reviewed and updated as appropriate: allergies, current medications, past family history, past medical history, past social history, past surgical history and problem list. Problem list updated.  Objective:   Vitals:   06/29/18 1152  BP: 97/82  Pulse: 75  Weight: 72.5 kg    Fetal Status: Fetal Heart Rate (bpm): 152 Fundal Height: 25 cm Movement: Present     General:  Alert, oriented and cooperative. Patient is in no acute distress.  Skin: Skin is warm and dry. No rash noted.   Cardiovascular: Normal heart rate noted  Respiratory: Normal respiratory effort, no problems with respiration noted  Abdomen: Soft, gravid, appropriate for gestational age.  Pain/Pressure: Present     Pelvic: Cervical exam deferred        Extremities: Normal range of motion.  Edema: None  Mental Status: Normal mood and affect. Normal behavior. Normal judgment and thought content.   Assessment and Plan:  Pregnancy: [id number] at [redacted]w[redacted]d  1. Supervision of other normal pregnancy, antepartum - Anticipatory guidance given about her fundal height not equalling her dates, she has her anatomy scan scheduled for 07/08/18.  - 200-400mg  magnesium daily before bed for insomnia - Advised stretching or yoga before bed for  hand-tingling/numbness at night  Preterm labor symptoms and general obstetric precautions including but not limited to vaginal bleeding, contractions, leaking of fluid and fetal movement were reviewed in detail with the patient. Please refer to After Visit Summary for other counseling recommendations.  Return in about 4 weeks (around 07/27/2018) for ROB.  Future Appointments  Date Time Provider Department Center  07/27/2018 10:35 AM Judeth Horn, NP [facility] W[facility]    Bernerd Limbo, Student-MidWife

## 2018-07-06 ENCOUNTER — Other Ambulatory Visit (HOSPITAL_COMMUNITY): Payer: Self-pay

## 2018-07-07 NOTE — Progress Notes (Unsigned)
Pt came to the front office stating that she thought that her OB US appt was today at the [facility] and she went today and was informed that it was actually on 06/08/18.  Pt requested to have [place] rescheduled.  OB US scheduled for 07/13/18 @ 1115 at [facility] due to pt being self pay.  Pt give information to appt at [facility] with address and telephone #.

## 2018-07-17 ENCOUNTER — Encounter: Payer: Self-pay | Admitting: *Deleted

## 2018-07-24 ENCOUNTER — Telehealth: Payer: Self-pay

## 2018-07-24 NOTE — Telephone Encounter (Signed)
Called to advise pt of Virtual appointment pt verbalized understanding with Interpreter [phone]

## 2018-07-27 ENCOUNTER — Encounter: Payer: Self-pay | Admitting: Student

## 2018-07-28 ENCOUNTER — Encounter: Payer: Self-pay | Admitting: Student

## 2018-09-07 ENCOUNTER — Telehealth: Payer: Self-pay | Admitting: Obstetrics & Gynecology

## 2018-09-07 NOTE — Telephone Encounter (Signed)
Scheduled the patient for 28 week labs. The scheduling is on two different days as it is what's available to have the patient come in to complete the testing. Called the patient to inform of upcoming appointment, left a detailed voicemail message and also sending a reminder letter of both appointments.

## 2018-09-15 ENCOUNTER — Encounter: Payer: Self-pay | Admitting: Student

## 2018-09-16 ENCOUNTER — Other Ambulatory Visit: Payer: Self-pay

## 2018-09-16 ENCOUNTER — Ambulatory Visit (INDEPENDENT_AMBULATORY_CARE_PROVIDER_SITE_OTHER): Payer: Self-pay

## 2018-09-16 DIAGNOSIS — Z3483 Encounter for supervision of other normal pregnancy, third trimester: Secondary | ICD-10-CM

## 2018-09-16 DIAGNOSIS — Z3A29 29 weeks gestation of pregnancy: Secondary | ICD-10-CM

## 2018-09-16 DIAGNOSIS — Z3480 Encounter for supervision of other normal pregnancy, unspecified trimester: Secondary | ICD-10-CM

## 2018-09-16 NOTE — Progress Notes (Signed)
Pt does not have a BP cuff.  

## 2018-09-16 NOTE — Progress Notes (Signed)
   TELEHEALTH VIRTUAL OBSTETRICS VISIT ENCOUNTER NOTE  I connected with Connie Boyd on 09/16/18 at 10:55 AM EDT by telephone at home and verified that I am speaking with the correct person using two identifiers.   I discussed the limitations, risks, security and privacy concerns of performing an evaluation and management service by telephone and the availability of in person appointments. I also discussed with the patient that there may be a patient responsible charge related to this service. The patient expressed understanding and agreed to proceed.  Subjective:  Connie Boyd is a 33 y.o. [phone] at [redacted]w[redacted]d being followed for ongoing prenatal care.  She is currently monitored for the following issues for this low-risk pregnancy and has Depression and Supervision of other normal pregnancy, antepartum on their problem list.  Patient reports no complaints. Reports fetal movement. Denies any contractions, bleeding or leaking of fluid.   The following portions of the patient's history were reviewed and updated as appropriate: allergies, current medications, past family history, past medical history, past social history, past surgical history and problem list.   Objective:   General:  Alert, oriented and cooperative.   Mental Status: Normal mood and affect perceived. Normal judgment and thought content.  Rest of physical exam deferred due to type of encounter  Assessment and Plan:  Pregnancy: G5P3013 at [redacted]w[redacted]d 1. Supervision of other normal pregnancy, antepartum -Patient does not have access to BP cuff. Will give on in office -Lab only appointment for GTT labs on Friday 5/22 -Anticipatory guidance of next visits reviewed with patient.  Preterm labor symptoms and general obstetric precautions including but not limited to vaginal bleeding, contractions, leaking of fluid and fetal movement were reviewed in detail with the patient.  I discussed the assessment and treatment plan  with the patient. The patient was provided an opportunity to ask questions and all were answered. The patient agreed with the plan and demonstrated an understanding of the instructions. The patient was advised to call back or seek an in-person office evaluation/go to MAU at [facility] for any urgent or concerning symptoms. Please refer to After Visit Summary for other counseling recommendations.   I provided 10 minutes of non-face-to-face time during this encounter.  Return in about 4 weeks (around 10/14/2018) for Return OB visit.  Future Appointments  Date Time Provider Department Center  09/16/2018 10:55 AM Rennie Plowman [facility]  09/18/2018  8:50 AM [facility]    Rolm Bookbinder, [place] Center for [employer], [facility]

## 2018-09-18 ENCOUNTER — Other Ambulatory Visit: Payer: Self-pay

## 2018-09-18 DIAGNOSIS — Z3480 Encounter for supervision of other normal pregnancy, unspecified trimester: Secondary | ICD-10-CM

## 2018-09-19 LAB — GLUCOSE TOLERANCE, 2 HOURS W/ 1HR
Glucose, 1 hour: 130 mg/dL (ref 65–179)
Glucose, 2 hour: 140 mg/dL (ref 65–152)
Glucose, Fasting: 76 mg/dL (ref 65–91)

## 2018-09-19 LAB — HIV ANTIBODY (ROUTINE TESTING W REFLEX): HIV Screen 4th Generation wRfx: NONREACTIVE

## 2018-09-19 LAB — CBC
Hematocrit: 31.8 % — ABNORMAL LOW (ref 34.0–46.6)
Hemoglobin: 11.1 g/dL (ref 11.1–15.9)
MCH: 31.4 pg (ref 26.6–33.0)
MCHC: 34.9 g/dL (ref 31.5–35.7)
MCV: 90 fL (ref 79–97)
Platelets: 171 10*3/uL (ref 150–450)
RBC: 3.54 x10E6/uL — ABNORMAL LOW (ref 3.77–5.28)
RDW: 13.2 % (ref 11.7–15.4)
WBC: 7.4 10*3/uL (ref 3.4–10.8)

## 2018-09-19 LAB — RPR: RPR Ser Ql: NONREACTIVE

## 2018-10-13 ENCOUNTER — Telehealth: Payer: Self-pay

## 2018-10-13 NOTE — Telephone Encounter (Signed)
Called the patient with the interrupter Etta to inform of the upcoming appointment as a virtual visit. The patient verbalized understanding.

## 2018-10-15 ENCOUNTER — Other Ambulatory Visit: Payer: Self-pay

## 2018-10-15 ENCOUNTER — Ambulatory Visit (INDEPENDENT_AMBULATORY_CARE_PROVIDER_SITE_OTHER): Payer: Self-pay | Admitting: Obstetrics and Gynecology

## 2018-10-15 DIAGNOSIS — Z3480 Encounter for supervision of other normal pregnancy, unspecified trimester: Secondary | ICD-10-CM

## 2018-10-15 DIAGNOSIS — O0933 Supervision of pregnancy with insufficient antenatal care, third trimester: Secondary | ICD-10-CM | POA: Insufficient documentation

## 2018-10-15 DIAGNOSIS — Z3A33 33 weeks gestation of pregnancy: Secondary | ICD-10-CM

## 2018-10-15 NOTE — Progress Notes (Signed)
   TELEHEALTH VIRTUAL OBSTETRICS VISIT ENCOUNTER NOTE  I connected with Connie Boyd [id number] on 10/15/18 at 10:55 AM EDT by telephone at home and verified that I am speaking with the correct person using two identifiers.   I discussed the limitations, risks, security and privacy concerns of performing an evaluation and management service by telephone and the availability of in person appointments. I also discussed with the patient that there may be a patient responsible charge related to this service. The patient expressed understanding and agreed to proceed.  Subjective:  Connie Boyd is a 33 y.o. [phone] at [redacted]w[redacted]d being followed for ongoing prenatal care.  She is currently monitored for the following issues for this low-risk pregnancy and has Depression; Supervision of other normal pregnancy, antepartum; and Limited prenatal care in third trimester on their problem list.  Patient reports no complaints. Reports fetal movement. Denies any contractions, bleeding or leaking of fluid.   The following portions of the patient's history were reviewed and updated as appropriate: allergies, current medications, past family history, past medical history, past social history, past surgical history and problem list.   Objective:   General:  Alert, oriented and cooperative.   Mental Status: Normal mood and affect perceived. Normal judgment and thought content.  Rest of physical exam deferred due to type of encounter  Assessment and Plan:  Pregnancy: G5P3013 at [redacted]w[redacted]d  1. Limited prenatal care in third trimester  Does not have BP cuff, did not pick up BP cuff. Needs a BP check ASAP.   2. Supervision of other normal pregnancy, antepartum  Doing well  Needs TDAP   Preterm labor symptoms and general obstetric precautions including but not limited to vaginal bleeding, contractions, leaking of fluid and fetal movement were reviewed in detail with the patient.  I discussed the assessment  and treatment plan with the patient. The patient was provided an opportunity to ask questions and all were answered. The patient agreed with the plan and demonstrated an understanding of the instructions. The patient was advised to call back or seek an in-person office evaluation/go to MAU at [facility] for any urgent or concerning symptoms. Please refer to After Visit Summary for other counseling recommendations.   I provided 12  minutes of non-face-to-face time during this encounter.  Return Patient needs to come in ASAP for BP check and to pick up BP cuff., for Schedule 2 weeks for virtual visit. Marland Kitchen  No future appointments.  Noni Saupe, NP Center for [employer], [place]

## 2018-10-15 NOTE — Progress Notes (Signed)
I connected with  Connie Boyd on 10/15/18 at 10:55 AM EDT by telephone and verified that I am speaking with the correct person using two identifiers.   I discussed the limitations, risks, security and privacy concerns of performing an evaluation and management service by telephone and the availability of in person appointments. I also discussed with the patient that there may be a patient responsible charge related to this service. The patient expressed understanding and agreed to proceed.  [place], [place] 10/15/2018  10:55 AM

## 2018-10-20 ENCOUNTER — Other Ambulatory Visit: Payer: Self-pay

## 2018-10-20 ENCOUNTER — Ambulatory Visit (INDEPENDENT_AMBULATORY_CARE_PROVIDER_SITE_OTHER): Payer: Self-pay | Admitting: Lactation Services

## 2018-10-20 VITALS — BP 119/79 | HR 79 | Wt 169.8 lb

## 2018-10-20 DIAGNOSIS — Z23 Encounter for immunization: Secondary | ICD-10-CM

## 2018-10-20 DIAGNOSIS — Z3480 Encounter for supervision of other normal pregnancy, unspecified trimester: Secondary | ICD-10-CM

## 2018-10-20 NOTE — Progress Notes (Signed)
Pt in today for nurse visit today. Spoke with pt with assistance of [place] # [phone].  Pt reports she is doing well with occasional non painful contractions. She denies leakage or discharge.  Pt reports headache to left side of her head since yesterday, she has taken Tylenol and it took the pain away. She has no other symptoms to report. She denies dizziness or blurred vision.   Pt reports some swelling to her extremities and feet. Swelling is unremarkable on exam. Enc pt to take walks, drink plenty of fluids and to elevate feet when sitting. BP WNL. Pt was given BP cuff an shown how to use. She does not have MyChart. Discussed taking BP daily and writing down. Pt was shown how to use the BP cuff and instructed to call the office for any BP > 140/90. Pt voiced understanding.   Tdap given after explaining what it is for. Pt tolerated well. Pt denies reaction to immunization in the past and denies any signs of illness today.    Pt to follow up 7/2 with Provider via Webex. Pt reports she has downloaded [employer].

## 2018-10-29 ENCOUNTER — Other Ambulatory Visit: Payer: Self-pay

## 2018-10-29 ENCOUNTER — Encounter (HOSPITAL_COMMUNITY): Payer: Self-pay | Admitting: *Deleted

## 2018-10-29 ENCOUNTER — Telehealth: Payer: Self-pay | Admitting: Family Medicine

## 2018-10-29 ENCOUNTER — Inpatient Hospital Stay (HOSPITAL_COMMUNITY)
Admission: AD | Admit: 2018-10-29 | Discharge: 2018-10-29 | Disposition: A | Discharge status: Home / Self Care | Payer: Self-pay | Attending: Obstetrics and Gynecology | Admitting: Obstetrics and Gynecology

## 2018-10-29 ENCOUNTER — Ambulatory Visit (INDEPENDENT_AMBULATORY_CARE_PROVIDER_SITE_OTHER): Payer: Self-pay | Admitting: Obstetrics and Gynecology

## 2018-10-29 VITALS — BP 152/98 | HR 72

## 2018-10-29 DIAGNOSIS — O133 Gestational [pregnancy-induced] hypertension without significant proteinuria, third trimester: Secondary | ICD-10-CM | POA: Insufficient documentation

## 2018-10-29 DIAGNOSIS — O139 Gestational [pregnancy-induced] hypertension without significant proteinuria, unspecified trimester: Secondary | ICD-10-CM | POA: Insufficient documentation

## 2018-10-29 DIAGNOSIS — Z789 Other specified health status: Secondary | ICD-10-CM

## 2018-10-29 DIAGNOSIS — O0933 Supervision of pregnancy with insufficient antenatal care, third trimester: Secondary | ICD-10-CM

## 2018-10-29 DIAGNOSIS — Z3A35 35 weeks gestation of pregnancy: Secondary | ICD-10-CM | POA: Insufficient documentation

## 2018-10-29 DIAGNOSIS — Z3480 Encounter for supervision of other normal pregnancy, unspecified trimester: Secondary | ICD-10-CM

## 2018-10-29 DIAGNOSIS — Z3689 Encounter for other specified antenatal screening: Secondary | ICD-10-CM

## 2018-10-29 LAB — URINALYSIS, ROUTINE W REFLEX MICROSCOPIC
Bilirubin Urine: NEGATIVE
Glucose, UA: NEGATIVE mg/dL
Hgb urine dipstick: NEGATIVE
Ketones, ur: NEGATIVE mg/dL
Nitrite: NEGATIVE
Protein, ur: NEGATIVE mg/dL
Specific Gravity, Urine: 1.008 (ref 1.005–1.030)
pH: 6 (ref 5.0–8.0)

## 2018-10-29 LAB — COMPREHENSIVE METABOLIC PANEL
ALT: 16 U/L (ref 0–44)
AST: 19 U/L (ref 15–41)
Albumin: 3.1 g/dL — ABNORMAL LOW (ref 3.5–5.0)
Alkaline Phosphatase: 188 U/L — ABNORMAL HIGH (ref 38–126)
Anion gap: 9 (ref 5–15)
BUN: 8 mg/dL (ref 6–20)
CO2: 20 mmol/L — ABNORMAL LOW (ref 22–32)
Calcium: 9 mg/dL (ref 8.9–10.3)
Chloride: 109 mmol/L (ref 98–111)
Creatinine, Ser: 0.65 mg/dL (ref 0.44–1.00)
GFR calc Af Amer: >60 mL/min (ref >60)
GFR calc non Af Amer: >60 mL/min (ref >60)
Glucose, Bld: 87 mg/dL (ref 70–99)
Potassium: 3.9 mmol/L (ref 3.5–5.1)
Sodium: 138 mmol/L (ref 135–145)
Total Bilirubin: 0.4 mg/dL (ref 0.3–1.2)
Total Protein: 6.1 g/dL — ABNORMAL LOW (ref 6.5–8.1)

## 2018-10-29 LAB — CBC
HCT: 31.6 % — ABNORMAL LOW (ref 36.0–46.0)
Hemoglobin: 10.6 g/dL — ABNORMAL LOW (ref 12.0–15.0)
MCH: 28.6 pg (ref 26.0–34.0)
MCHC: 33.5 g/dL (ref 30.0–36.0)
MCV: 85.4 fL (ref 80.0–100.0)
Platelets: 201 10*3/uL (ref 150–400)
RBC: 3.7 MIL/uL — ABNORMAL LOW (ref 3.87–5.11)
RDW: 13.1 % (ref 11.5–15.5)
WBC: 7 10*3/uL (ref 4.0–10.5)
nRBC: 0 % (ref 0.0–0.2)

## 2018-10-29 LAB — PROTEIN / CREATININE RATIO, URINE
Creatinine, Urine: 52.11 mg/dL
Protein Creatinine Ratio: 0.21 mg/mg{Cre} — ABNORMAL HIGH (ref 0.00–0.15)
Total Protein, Urine: 11 mg/dL

## 2018-10-29 NOTE — Progress Notes (Signed)
   TELEHEALTH VIRTUAL OBSTETRICS VISIT ENCOUNTER NOTE  I connected with Connie Boyd on 10/29/18 at  8:15 AM EDT by telephone at home and verified that I am speaking with the correct person using two identifiers.   I discussed the limitations, risks, security and privacy concerns of performing an evaluation and management service by telephone and the availability of in person appointments. I also discussed with the patient that there may be a patient responsible charge related to this service. The patient expressed understanding and agreed to proceed.  Subjective:  Connie Boyd is a 33 y.o. [phone] at [redacted]w[redacted]d being followed for ongoing prenatal care.  She is currently monitored for the following issues for this high-risk pregnancy and has Depression; Supervision of other normal pregnancy, antepartum; and Limited prenatal care in third trimester on their problem list.  Patient reports no complaints. Reports fetal movement. Denies any contractions, bleeding or leaking of fluid.   The following portions of the patient's history were reviewed and updated as appropriate: allergies, current medications, past family history, past medical history, past social history, past surgical history and problem list.   Objective:   General:  Alert, oriented and cooperative.   Mental Status: Normal mood and affect perceived. Normal judgment and thought content.  Rest of physical exam deferred due to type of encounter  Assessment and Plan:  Pregnancy: G5P3013 at [redacted]w[redacted]d  1. Non-English speaking patient  Interpretor present today   2. Limited prenatal care in third trimester  + fetal movement, next visit in-person for GBS  3. Supervision of other normal pregnancy, antepartum  4. Gestational hypertension, third trimester  Complains of increase swelling in hands and feet since Sunday.  No HA, or scotoma.  Patient to go to MAU for labs and BP check.  Preeclampsia precautions.    134/81 on Monday  125/79 on Tuesday 140/81 Wednesday  155/94 Thursday 152/98 Recheck    There are no diagnoses linked to this encounter. Preterm labor symptoms and general obstetric precautions including but not limited to vaginal bleeding, contractions, leaking of fluid and fetal movement were reviewed in detail with the patient.  I discussed the assessment and treatment plan with the patient. The patient was provided an opportunity to ask questions and all were answered. The patient agreed with the plan and demonstrated an understanding of the instructions. The patient was advised to call back or seek an in-person office evaluation/go to [facility]U at [facility] for any urgent or concerning symptoms. Please refer to After Visit Summary for other counseling recommendations.   I provided 13 minutes of non-face-to-face time during this encounter.  Return in about 1 week (around 11/05/2018) for Patient should have in person visit for GBS/cultures. .  No future appointments.  Noni Saupe, NP [facility] [employer], [place]

## 2018-10-29 NOTE — Telephone Encounter (Signed)
Attempted to call patient about an appointment follow-up for Monday 07/06. Interpreter [phone] left a message about her appointment.

## 2018-10-29 NOTE — Discharge Instructions (Signed)
Hipertensión durante el embarazo °Hypertension During Pregnancy °La hipertensión también se denomina presión arterial alta. La presión arterial alta significa que la sangre se mueve por el cuerpo con mucha fuerza. Puede causar problemas para usted y su bebé. Durante el embarazo pueden producirse diferentes tipos de presión arterial alta. Los tipos son los siguientes: °· Presión arterial alta antes de quedar embarazada. Esto se denomina hipertensión crónica..  Puede continuar durante el embarazo. El médico querrá continuar controlándole la presión arterial. Es posible que necesite medicamentos para mantener la presión arterial bajo control mientras está embarazada. Deberá concurrir a visitas de seguimiento después de haber tenido el bebé. °· Presión arterial alta que sube durante el embarazo cuando antes era normal. Esto se denomina hipertensión gestacional. Por lo general, mejorará después de que tenga el bebé, pero el médico deberá controlarle la presión arterial para asegurarse de que esté mejorando. °· Presión arterial muy alta durante el embarazo. Esto se denomina preeclampsia. La presión arterial muy alta es una emergencia que debe controlarse y tratarse de inmediato. °· Puede desarrollar presión arterial muy alta después del parto. Esto se denomina preeclampsia posparto. Esto normalmente ocurre dentro de las 48 horas después del nacimiento del bebé, pero puede aparecer hasta 6 semanas después de dar a luz. Esto es poco frecuente. °¿De qué modo me afecta? °Si tiene presión arterial alta durante el embarazo, tiene más probabilidades de desarrollar presión arterial alta: °· A medida que envejece. °· Si queda embarazada nuevamente. °En algunos casos, la presión arterial alta durante el embarazo puede causar lo siguiente: °· Accidente cerebrovascular. °· Infarto de miocardio. °· Daño en los riñones, los pulmones o el hígado. °· Preeclampsia. °· Movimientos espasmódicos que no puede controlar  (convulsiones). °· Problemas con la placenta. °¿Cómo afecta esto al bebé? °El bebé puede: °· Nacer antes de tiempo. °· No pesar tanto como debería. °· No controlar bien el trabajo de parto, lo que conduce a un parto por cesárea. °¿Cuáles son los riesgos? °· Haber tenido presión arterial alta durante un embarazo anterior. °· Tener sobrepeso. °· Tener 35 años o más. °· Estar embarazada por primera vez. °· Estar embarazada de más de un bebé. °· Embarazarse a través de métodos de fertilización como fertilización in vitro (FIV). °· Tener otros problemas, como diabetes o enfermedad renal. °· Tener familiares con presión arterial alta. °¿Qué puedo hacer para disminuir el riesgo? ° °· Mantenga un peso saludable. °· Siga una dieta saludable. °· Siga las indicaciones del médico acerca de tratar cualquier problema médico que tenga antes de quedar embarazada. °Es muy importante concurrir a todas las visitas al médico. El médico verificará su presión arterial y se asegurará de que el embarazo progrese como corresponde. El tratamiento debe comenzar con prontitud si se detecta un problema. °¿Cómo se trata? °El tratamiento de la presión arterial alta durante el embarazo puede variar según el tipo de presión arterial alta que tenga y su gravedad. °· Es posible que tenga que tomar medicamentos para la presión arterial. °· Si ha estado tomando un medicamento para la presión arterial, es posible que deba cambiar el medicamento durante el embarazo si no es seguro para el bebé. °· Si el médico cree que usted podría tener presión arterial muy alta, puede indicarle que tome una dosis baja de aspirina durante el embarazo. °· Si tiene presión arterial muy alta, es posible que deba permanecer en el hospital para que usted y el bebé puedan ser vigilados de cerca. Es posible que deba tomar medicamentos para disminuir la   presin arterial. Este medicamento se puede administrar por boca o a travs de un tubo (catter) intravenoso.  En ciertos  casos, si su afeccin empeora, es posible que necesite dar a luz prematuramente. Siga estas instrucciones en su casa: Comida y bebida   Beba suficiente lquido para mantener el pis (laorina) de color amarillo plido.  Evite la cafena. Estilo de vida  No consuma ningn producto que contenga nicotina o tabaco, como cigarrillos, cigarrillos electrnicos y tabaco de Higher education careers adviser. Si necesita ayuda para dejar de fumar, consulte al mdico.  No consuma drogas ni alcohol.  Evite el estrs.  Haga reposo y [place].  La actividad fsica regular puede ayudar. Consulte al mdico qu tipos de ejercicios son mejores para usted. Indicaciones generales  Use los medicamentos de venta libre y los recetados solamente como se lo haya indicado el mdico.  Concurra a todas las visitas de atencin prenatal y de seguimiento como se lo haya indicado el mdico. Esto es importante. Comunquese con un mdico si:  Tiene sntomas a los que su mdico le indic que debera estar [place], tales como: ? Dolores de [place]. ? Nuseas. ? Vmitos. ? Dolor en el vientre (abdominal). ? Mareos. ? Desvanecimiento. Solicite ayuda inmediatamente si:  Tiene lo siguiente: ? Dolor muy intenso en el vientre que no mejora con el [place]. ? Dolor de cabeza muy intenso que no mejora. ? Vmitos que no mejoran. ? Aumenta de peso de forma rpida y repentina. ? Hinchazn repentina en las manos, los tobillos o el rostro. ? Hemorragia vaginal. ? Sangre en la orina. ? Visin borrosa. ? Visin doble. ? Falta de aire. ? Tourist information centre manager. ? Debilidad en un lado del cuerpo. ? Dificultad para hablar.  El beb no se mueve tanto como sera usual. Resumen  La presin arterial alta tambin se denomina hipertensin.  La presin arterial alta significa que la sangre se mueve por el cuerpo con mucha fuerza.  La presin arterial alta puede causarles problemas a usted y al beb.  Concurra a todas las visitas de seguimiento  como se lo haya indicado el mdico. Esto es importante. Esta informacin no tiene Marine scientist el consejo del mdico. Asegrese de hacerle al mdico cualquier pregunta que tenga. Document Released: 07/31/2010 Document Revised: 06/12/2018 Document Reviewed: 06/12/2018 Elsevier Patient Education  2020 [employer].

## 2018-10-29 NOTE — Progress Notes (Signed)
I connected with  Connie Boyd on 10/29/18 at  8:15 AM EDT by telephone and verified that I am speaking with the correct person using two identifiers.   I discussed the limitations, risks, security and privacy concerns of performing an evaluation and management service by telephone and the availability of in person appointments. I also discussed with the patient that there may be a patient responsible charge related to this service. The patient expressed understanding and agreed to proceed.  [name], CMA 10/29/2018  8:21 AM   [facility] interpreter helped with this visit.

## 2018-10-29 NOTE — Patient Instructions (Signed)
Hipertensión durante el embarazo °Hypertension During Pregnancy °La hipertensión también se denomina presión arterial alta. La presión arterial alta significa que la sangre se mueve por el cuerpo con mucha fuerza. Puede causar problemas para usted y su bebé. Durante el embarazo pueden producirse diferentes tipos de presión arterial alta. Los tipos son los siguientes: °· Presión arterial alta antes de quedar embarazada. Esto se denomina hipertensión crónica..  Puede continuar durante el embarazo. El médico querrá continuar controlándole la presión arterial. Es posible que necesite medicamentos para mantener la presión arterial bajo control mientras está embarazada. Deberá concurrir a visitas de seguimiento después de haber tenido el bebé. °· Presión arterial alta que sube durante el embarazo cuando antes era normal. Esto se denomina hipertensión gestacional. Por lo general, mejorará después de que tenga el bebé, pero el médico deberá controlarle la presión arterial para asegurarse de que esté mejorando. °· Presión arterial muy alta durante el embarazo. Esto se denomina preeclampsia. La presión arterial muy alta es una emergencia que debe controlarse y tratarse de inmediato. °· Puede desarrollar presión arterial muy alta después del parto. Esto se denomina preeclampsia posparto. Esto normalmente ocurre dentro de las 48 horas después del nacimiento del bebé, pero puede aparecer hasta 6 semanas después de dar a luz. Esto es poco frecuente. °¿De qué modo me afecta? °Si tiene presión arterial alta durante el embarazo, tiene más probabilidades de desarrollar presión arterial alta: °· A medida que envejece. °· Si queda embarazada nuevamente. °En algunos casos, la presión arterial alta durante el embarazo puede causar lo siguiente: °· Accidente cerebrovascular. °· Infarto de miocardio. °· Daño en los riñones, los pulmones o el hígado. °· Preeclampsia. °· Movimientos espasmódicos que no puede controlar  (convulsiones). °· Problemas con la placenta. °¿Cómo afecta esto al bebé? °El bebé puede: °· Nacer antes de tiempo. °· No pesar tanto como debería. °· No controlar bien el trabajo de parto, lo que conduce a un parto por cesárea. °¿Cuáles son los riesgos? °· Haber tenido presión arterial alta durante un embarazo anterior. °· Tener sobrepeso. °· Tener 35 años o más. °· Estar embarazada por primera vez. °· Estar embarazada de más de un bebé. °· Embarazarse a través de métodos de fertilización como fertilización in vitro (FIV). °· Tener otros problemas, como diabetes o enfermedad renal. °· Tener familiares con presión arterial alta. °¿Qué puedo hacer para disminuir el riesgo? ° °· Mantenga un peso saludable. °· Siga una dieta saludable. °· Siga las indicaciones del médico acerca de tratar cualquier problema médico que tenga antes de quedar embarazada. °Es muy importante concurrir a todas las visitas al médico. El médico verificará su presión arterial y se asegurará de que el embarazo progrese como corresponde. El tratamiento debe comenzar con prontitud si se detecta un problema. °¿Cómo se trata? °El tratamiento de la presión arterial alta durante el embarazo puede variar según el tipo de presión arterial alta que tenga y su gravedad. °· Es posible que tenga que tomar medicamentos para la presión arterial. °· Si ha estado tomando un medicamento para la presión arterial, es posible que deba cambiar el medicamento durante el embarazo si no es seguro para el bebé. °· Si el médico cree que usted podría tener presión arterial muy alta, puede indicarle que tome una dosis baja de aspirina durante el embarazo. °· Si tiene presión arterial muy alta, es posible que deba permanecer en el hospital para que usted y el bebé puedan ser vigilados de cerca. Es posible que deba tomar medicamentos para disminuir la   presión arterial. Este medicamento se puede administrar por boca o a través de un tubo (catéter) intravenoso. °· En ciertos  casos, si su afección empeora, es posible que necesite dar a luz prematuramente. °Siga estas instrucciones en su casa: °Comida y bebida ° °· Beba suficiente líquido para mantener el pis (laorina) de color amarillo pálido. °· Evite la cafeína. °Estilo de vida °· No consuma ningún producto que contenga nicotina o tabaco, como cigarrillos, cigarrillos electrónicos y tabaco de mascar. Si necesita ayuda para dejar de fumar, consulte al médico. °· No consuma drogas ni alcohol. °· Evite el estrés. °· Haga reposo y duerma bien. °· La actividad física regular puede ayudar. Consulte al médico qué tipos de ejercicios son mejores para usted. °Indicaciones generales °· Use los medicamentos de venta libre y los recetados solamente como se lo haya indicado el médico. °· Concurra a todas las visitas de atención prenatal y de seguimiento como se lo haya indicado el médico. Esto es importante. °Comuníquese con un médico si: °· Tiene síntomas a los que su médico le indicó que debería estar [place], tales como: °? Dolores de cabeza. °? Náuseas. °? Vómitos. °? Dolor en el vientre (abdominal). °? Mareos. °? Desvanecimiento. °Solicite ayuda inmediatamente si: °· Tiene lo siguiente: °? Dolor muy intenso en el vientre que no mejora con el tratamiento. °? Dolor de cabeza muy intenso que no mejora. °? Vómitos que no mejoran. °? Aumenta de peso de forma rápida y repentina. °? Hinchazón repentina en las manos, los tobillos o el rostro. °? Hemorragia vaginal. °? Sangre en la orina. °? Visión borrosa. °? Visión doble. °? Falta de aire. °? Dolor en el pecho. °? Debilidad en un lado del cuerpo. °? Dificultad para hablar. °· El bebé no se mueve tanto como sería usual. °Resumen °· La presión arterial alta también se denomina hipertensión. °· La presión arterial alta significa que la sangre se mueve por el cuerpo con mucha fuerza. °· La presión arterial alta puede causarles problemas a usted y al bebé. °· Concurra a todas las visitas de seguimiento  como se lo haya indicado el médico. Esto es importante. °Esta información no tiene como fin reemplazar el consejo del médico. Asegúrese de hacerle al médico cualquier pregunta que tenga. °Document Released: 07/31/2010 Document Revised: 06/12/2018 Document Reviewed: 06/12/2018 °Elsevier Patient Education © 2020 Elsevier Inc. ° °

## 2018-10-29 NOTE — MAU Note (Signed)
Pt sent from MD office for BP evaluation.  Denies H/A, visual disturbances, and epigastric pain.  Denies LOF or VB.  Endorses +FM.

## 2018-10-29 NOTE — MAU Provider Note (Signed)
History     CSN: [id number]  Arrival date and time: 10/29/18 1005   None     Chief Complaint  Patient presents with  . BP Evaluation   G5P3013 presenting for elevated BP at home today. Denies HA, visual disturbances, RUQ pain, SOB, and CP. Reports +FM. No pregnancy c/o. Her pregnancy has been uncomplicated.   OB History    Gravida  5   Para  3   Term  3   Preterm      AB  1   Living  3     SAB  1   TAB      Ectopic      Multiple      Live Births  3           History reviewed. No pertinent past medical history.  Past Surgical History:  Procedure Laterality Date  . APPENDECTOMY    . OVARIAN CYST REMOVAL      Family History  Problem Relation Age of Onset  . Cancer Paternal Uncle   . Diabetes Maternal Grandfather   . Cancer Mother   . Anesthesia problems Neg Hx   . Other Neg Hx     Social History   Tobacco Use  . Smoking status: Never Smoker  . Smokeless tobacco: Never Used  Substance Use Topics  . Alcohol use: Yes    Alcohol/week: 1.0 standard drinks    Types: 1 Cans of beer per week  . Drug use: No    Allergies: No Known Allergies  Medications Prior to Admission  Medication Sig Dispense Refill Last Dose  . FOLIC ACID PO Take 1 tablet by mouth daily.   10/28/2018 at 2230  . Magnesium 200 MG TABS Take 1 tablet (200 mg total) by mouth at bedtime. 30 each 3 Past Week at Unknown time  . Prenatal Vit-Fe Fumarate-FA (MULTIVITAMIN-PRENATAL) 27-0.8 MG TABS tablet Take 1 tablet by mouth daily at 12 noon.   10/28/2018 at 2230  . acetaminophen (TYLENOL) 325 MG tablet Take 325 mg by mouth daily as needed for mild pain.       Review of Systems  Eyes: Negative for visual disturbance.  Respiratory: Negative for shortness of breath.   Cardiovascular: Positive for leg swelling (feet and hands). Negative for chest pain.  Gastrointestinal: Negative for abdominal pain.  Genitourinary: Negative for vaginal bleeding.  Neurological: Negative for headaches.    Physical Exam   Blood pressure 139/86, pulse 75, temperature 98.1 F (36.7 C), temperature source Oral, resp. rate 20, last menstrual period 02/23/2018, SpO2 100 %, currently breastfeeding. Patient Vitals for the past 24 hrs:  BP Temp Temp src Pulse Resp SpO2  10/29/18 1215 139/86 98.1 F (36.7 C) Oral 75 20 100 %  10/29/18 1130 (!) 142/86 - - 81 - -  10/29/18 1115 136/89 - - 85 - 99 %  10/29/18 1045 139/87 - - 84 - 99 %  10/29/18 1030 (!) 143/86 - - 79 - -  10/29/18 1029 - - - - - 99 %  10/29/18 1026 (!) 153/90 98.6 F (37 C) Oral 86 20 100 %   Physical Exam  Nursing note and vitals reviewed. Constitutional: She appears well-developed and well-nourished. No distress.  HENT:  Head: Normocephalic and atraumatic.  Neck: Normal range of motion.  Cardiovascular: Normal rate.  Respiratory: Effort normal. No respiratory distress.  Musculoskeletal: Normal range of motion.        General: No edema.  Neurological: She is alert.  Skin:  Skin is warm and dry.  Psychiatric: She has a normal mood and affect.  EFM: 145 bpm, mod variability, + accels, no decels Toco: irregular, irritability  Results for orders placed or performed during the hospital encounter of 10/29/18 (from the past 24 hour(s))  Urinalysis, Routine w reflex microscopic     Status: Abnormal   Collection Time: 10/29/18 10:25 AM  Result Value Ref Range   Color, Urine YELLOW YELLOW   APPearance HAZY (A) CLEAR   Specific Gravity, Urine 1.008 1.005 - 1.030   pH 6.0 5.0 - 8.0   Glucose, UA NEGATIVE NEGATIVE mg/dL   Hgb urine dipstick NEGATIVE NEGATIVE   Bilirubin Urine NEGATIVE NEGATIVE   Ketones, ur NEGATIVE NEGATIVE mg/dL   Protein, ur NEGATIVE NEGATIVE mg/dL   Nitrite NEGATIVE NEGATIVE   Leukocytes,Ua SMALL (A) NEGATIVE   RBC / HPF 0-5 0 - 5 RBC/hpf   WBC, UA 0-5 0 - 5 WBC/hpf   Bacteria, UA RARE (A) NONE SEEN   Squamous Epithelial / LPF 21-50 0 - 5  Protein / creatinine ratio, urine     Status: Abnormal    Collection Time: 10/29/18 10:25 AM  Result Value Ref Range   Creatinine, Urine 52.11 mg/dL   Total Protein, Urine 11 mg/dL   Protein Creatinine Ratio 0.21 (H) 0.00 - 0.15 mg/mg[Cre]  CBC     Status: Abnormal   Collection Time: 10/29/18 11:02 AM  Result Value Ref Range   WBC 7.0 4.0 - 10.5 K/uL   RBC 3.70 (L) 3.87 - 5.11 MIL/uL   Hemoglobin 10.6 (L) 12.0 - 15.0 g/dL   HCT 16.131.6 (L) 09.636.0 - 04.546.0 %   MCV 85.4 80.0 - 100.0 fL   MCH 28.6 26.0 - 34.0 pg   MCHC 33.5 30.0 - 36.0 g/dL   RDW 40.913.1 81.111.5 - 91.415.5 %   Platelets 201 150 - 400 K/uL   nRBC 0.0 0.0 - 0.2 %  Comprehensive metabolic panel     Status: Abnormal   Collection Time: 10/29/18 11:02 AM  Result Value Ref Range   Sodium 138 135 - 145 mmol/L   Potassium 3.9 3.5 - 5.1 mmol/L   Chloride 109 98 - 111 mmol/L   CO2 20 (L) 22 - 32 mmol/L   Glucose, Bld 87 70 - 99 mg/dL   BUN 8 6 - 20 mg/dL   Creatinine, Ser 7.820.65 0.44 - 1.00 mg/dL   Calcium 9.0 8.9 - 95.610.3 mg/dL   Total Protein 6.1 (L) 6.5 - 8.1 g/dL   Albumin 3.1 (L) 3.5 - 5.0 g/dL   AST 19 15 - 41 U/L   ALT 16 0 - 44 U/L   Alkaline Phosphatase 188 (H) 38 - 126 U/L   Total Bilirubin 0.4 0.3 - 1.2 mg/dL   GFR calc non Af Amer >60 >60 mL/min   GFR calc Af Amer >60 >60 mL/min   Anion gap 9 5 - 15   MAU Course  Procedures  MDM Labs ordered and reviewed. No evidence of PEC. Asymptomatic and no severe range BPs. Plan for BP check in office next week, IOL at 37 wks. Recommend daily home BP monitoring. Stable for discharge home.   Assessment and Plan   1. [redacted] weeks gestation of pregnancy   2. Non-English speaking patient   3. Gestational hypertension, third trimester   4. Limited prenatal care in third trimester   5. NST (non-stress test) reactive    Discharge home Follow up at [facility] next week PEC precautions  Allergies as of 10/29/2018   No Known Allergies     Medication List    TAKE these medications   acetaminophen 325 MG tablet Commonly known as: TYLENOL Take  325 mg by mouth daily as needed for mild pain.   FOLIC ACID PO Take 1 tablet by mouth daily.   Magnesium 200 MG Tabs Take 1 tablet (200 mg total) by mouth at bedtime.   multivitamin-prenatal 27-0.8 MG Tabs tablet Take 1 tablet by mouth daily at 12 noon.       Liver interpreter present  Julianne Handler, CNM 10/29/2018, 12:37 PM

## 2018-11-02 ENCOUNTER — Telehealth (HOSPITAL_COMMUNITY): Payer: Self-pay | Admitting: *Deleted

## 2018-11-02 ENCOUNTER — Ambulatory Visit: Payer: Self-pay

## 2018-11-02 ENCOUNTER — Other Ambulatory Visit: Payer: Self-pay | Admitting: Advanced Practice Midwife

## 2018-11-02 ENCOUNTER — Other Ambulatory Visit: Payer: Self-pay

## 2018-11-02 ENCOUNTER — Ambulatory Visit (INDEPENDENT_AMBULATORY_CARE_PROVIDER_SITE_OTHER): Payer: Medicaid Other | Admitting: *Deleted

## 2018-11-02 ENCOUNTER — Encounter (HOSPITAL_COMMUNITY): Payer: Self-pay | Admitting: *Deleted

## 2018-11-02 ENCOUNTER — Ambulatory Visit (INDEPENDENT_AMBULATORY_CARE_PROVIDER_SITE_OTHER): Payer: Self-pay | Admitting: Obstetrics & Gynecology

## 2018-11-02 VITALS — BP 138/88 | HR 83 | Wt 171.0 lb

## 2018-11-02 DIAGNOSIS — O133 Gestational [pregnancy-induced] hypertension without significant proteinuria, third trimester: Secondary | ICD-10-CM | POA: Diagnosis present

## 2018-11-02 DIAGNOSIS — Z3A36 36 weeks gestation of pregnancy: Secondary | ICD-10-CM

## 2018-11-02 DIAGNOSIS — Z113 Encounter for screening for infections with a predominantly sexual mode of transmission: Secondary | ICD-10-CM

## 2018-11-02 DIAGNOSIS — Z3A33 33 weeks gestation of pregnancy: Secondary | ICD-10-CM | POA: Diagnosis not present

## 2018-11-02 DIAGNOSIS — Z789 Other specified health status: Secondary | ICD-10-CM

## 2018-11-02 DIAGNOSIS — Z3480 Encounter for supervision of other normal pregnancy, unspecified trimester: Secondary | ICD-10-CM

## 2018-11-02 LAB — OB RESULTS CONSOLE GC/CHLAMYDIA: Gonorrhea: NEGATIVE

## 2018-11-02 NOTE — Progress Notes (Signed)
Video interpreter Connie Boyd [phone] used for encounter today. Pt presents for NST/BPP today as scheduled She was seen @ MAU on 7/2 for evaluation of BP due to elevated BP @ home.  BP was elevated during MAU visit as well, labs were normal. Pt denies H/A or visual disturbances. IOL scheduled 7/13 @ 0730.

## 2018-11-02 NOTE — Patient Instructions (Signed)
Parto vaginal Vaginal Delivery  Parto vaginal significa que usted da a luz empujando al beb fuera del canal del parto (vagina). Un equipo de proveedores de atencin mdica la ayudar antes, durante y despus del parto vaginal. Las experiencias de los nacimientos son nicas para todas las mujeres, y cada embarazo y las experiencias de nacimiento varan segn dnde elija dar a luz. Qu ocurrir cuando llegue al centro de parto o al hospital? Una vez que se inicie el trabajo de parto y haya sido admitida en el hospital o centro de parto, el mdico podr hacer lo siguiente:  Revisar sus antecedentes de embarazo y cualquier inquietud que usted pueda tener.  Colocarle una va intravenosa en una de las venas. Esto se podr usar para administrarle lquidos y medicamentos.  Verificar su presin arterial, pulso, temperatura y frecuencia cardaca (signos vitales).  Verificar si la bolsa de agua (saco amnitico) se ha roto (ruptura).  Hablar con usted sobre su plan de nacimiento y analizar las opciones para controlar el dolor. Monitoreo Su mdico puede monitorear las contracciones (monitoreo uterino) y la frecuencia cardaca del beb (monitoreo fetal). Es posible que el monitoreo se necesite realizar:  Con frecuencia, pero no continuamente (intermitentemente).  Todo el tiempo o durante largos perodos a la vez (continuamente). El monitoreo continuo puede ser necesario si: ? Est recibiendo determinados medicamentos, tales como medicamentos para aliviar el dolor o para hacer que las contracciones sean ms fuertes. ? Tiene complicaciones durante el embarazo o el trabajo de parto. El monitoreo se puede realizar:  Al colocar un estetoscopio especial o un dispositivo manual de monitoreo en el abdomen o verificar los latidos cardacos del beb y comprobar las contracciones.  Al colocar monitores en el abdomen (monitores externos) para registrar los latidos cardacos del beb y la frecuencia y duracin de  las contracciones.  Al colocar monitores dentro del tero a travs de la vagina (monitores internos) para registrar los latidos cardacos del beb y la frecuencia, duracin y fuerza de sus contracciones. Segn el tipo de monitor, puede permanecer en el tero o en la cabeza del beb hasta el nacimiento.  Telemetra. Se trata de un tipo de monitoreo continuo que se puede realizar con monitores externos o internos. En lugar de tener que permanecer en la cama, usted puede moverse durante la telemetra. Examen fsico Su mdico puede realizar exmenes fsicos frecuentes. Esto puede incluir lo siguiente:  Verificar cmo y dnde el beb est ubicado en el tero.  Verificar el cuello uterino para determinar: ? Si se est afinando o estirando (borrando). ? Si se est abriendo (dilatando). Qu sucede durante el trabajo de parto y el parto?  El trabajo de parto y el parto normales se dividen en tres etapas: Etapa 1  Esta es la etapa ms larga del trabajo de parto.  Esta etapa puede durar horas o das.  Durante esta etapa, sentir contracciones. En general, las contracciones son leves, infrecuentes e irregulares al principio. Se hacen ms fuertes, ms frecuentes (aproximadamente cada 2 o 3 minutos) y ms regulares a medida que avanza en esta etapa.  Esta etapa finaliza cuando el cuello uterino est completamente dilatado hasta 4 pulgadas (10cm) y completamente borrado. Etapa 2  Esta etapa comienza una vez que el cuello uterino est totalmente borrado y dilatado, y dura hasta el nacimiento del beb.  Esta etapa puede durar de 20 minutos a 2 horas.  Esta es la etapa en la que va a sentir ganas de pujar al beb fuera de la   vagina. °· Puede sentir un dolor urente y por estiramiento, especialmente cuando la parte más ancha de la cabeza del bebé pasa a través de la abertura vaginal (coronación). °· Una vez que el bebé nace, el cordón umbilical se pinzará y se cortará. Esto ocurre por lo general después  de un período de 1 a 2 minutos después del parto. °· Colocarán al bebé sobre su pecho desnudo (contacto piel con piel) en una posición erguida y cubierto con una manta abrigada. Observe al bebé para detectar señales de hambre, como el reflejo de búsqueda o succión, y acérquelo al pecho para su primera alimentación. °Etapa 3 °· Esta etapa comienza inmediatamente después del nacimiento del bebé y finaliza después de la expulsión de la placenta. °· Esta etapa puede durar de 5 a 30 minutos. °· Después del nacimiento del bebé, puede sentir contracciones cuando el cuerpo expulsa la placenta y el útero se contrae para controlar la hemorragia. °¿Qué puedo esperar después del trabajo de parto y el parto? °· Una vez que termine el trabajo de parto, se los controlará a usted y al bebé atentamente para tener la seguridad de que ambos estén sanos y listos para ir a casa. Su equipo de atención médica le enseñará cómo cuidarse y cuidar a su bebé. °· Usted y el bebé permanecerán en la misma habitación (cohabitación) durante su estadía en el hospital. Esto estimulará una vinculación temprana y una lactancia exitosa. °· Puede seguir recibiendo líquidos o medicamentos por vía intravenosa. °· Se le controlará y masajeará el útero con regularidad (masaje fúndico). °· Tendrá algo de inflamación y dolor en el abdomen, la vagina y la zona de la piel entre la abertura vaginal y el ano (perineo). °· Si se le realizó una incisión cerca de la vagina (episiotomía) o si ha tenido algún desgarro durante el parto, podrían indicarle que se coloque compresas frías sobre la episiotomía o el desgarro. Esto ayuda a aliviar el dolor y la hinchazón. °· Es posible que le den una botella rociadora para que use cuando vaya al baño para higienizarse. Siga los pasos a continuación para usar la botella rociadora: °? Antes de orinar, llene la botella rociadora con agua tibia. No use agua caliente. °? Después de orinar, mientras aún está sentada en el inodoro,  use la botella rociadora para enjuagar el área alrededor de la uretra y la abertura vaginal. Con esto podrá limpiar cualquier rastro de orina y sangre. °? Llene la botella rociadora con agua limpia cada vez que vaya al baño. °· Es normal tener hemorragia vaginal después del parto. Use un apósito sanitario para el sangrado vaginal y secreción. °Resumen °· Parto vaginal significa que usted dará a luz empujando al bebé fuera del canal del parto (vagina). °· Su médico puede monitorear las contracciones (monitoreo uterino) y la frecuencia cardíaca del bebé (monitoreo fetal). °· Su médico puede realizarle un examen físico. °· El trabajo de parto y el parto normales se dividen en tres etapas. °· Una vez que termina el trabajo de parto, se los controlará a usted y al bebé atentamente hasta que estén listos para ir a casa. °Esta información no tiene como fin reemplazar el consejo del médico. Asegúrese de hacerle al médico cualquier pregunta que tenga. °Document Released: 03/28/2008 Document Revised: 06/25/2017 Document Reviewed: 06/25/2017 °Elsevier Patient Education © 2020 [employer] Inc. ° °

## 2018-11-02 NOTE — Progress Notes (Signed)
TELEHEALTH VIRTUAL OBSTETRICS VISIT ENCOUNTER NOTE  I connected with Connie Boyd [id number] on 11/02/18 at  2:55 PM EDT by telephone at home and verified that I am speaking with the correct person using two identifiers.   I discussed the limitations, risks, security and privacy concerns of performing an evaluation and management service by telephone and the availability of in person appointments. I also discussed with the patient that there may be a patient responsible charge related to this service. The patient expressed understanding and agreed to proceed.  Subjective:  Connie Boyd is a 33 y.o. [phone] at [redacted]w[redacted]d being followed for ongoing prenatal care.  She is currently monitored for the following issues for this low-risk pregnancy and has Depression; Supervision of other normal pregnancy, antepartum; Limited prenatal care in third trimester; Non-English speaking patient; and Gestational hypertension on their problem list.  Patient reports no complaints. Reports fetal movement. Denies any contractions, bleeding or leaking of fluid.   The following portions of the patient's history were reviewed and updated as appropriate: allergies, current medications, past family history, past medical history, past social history, past surgical history and problem list.   Objective:   General:  Alert, oriented and cooperative.   Mental Status: Normal mood and affect perceived. Normal judgment and thought content.  Rest of physical exam deferred due to type of encounter  Assessment and Plan:     PRENATAL VISIT NOTE  Subjective:  Connie Boyd is a 33 y.o. [phone] at [redacted]w[redacted]d being seen today for ongoing prenatal care.  She is currently monitored for the following issues for this high-risk pregnancy and has Depression; Supervision of other normal pregnancy, antepartum; Limited prenatal care in third trimester; Non-English speaking patient; and Gestational hypertension on their  problem list.  Patient reports no complaints.   .  .   . Denies leaking of fluid.   The following portions of the patient's history were reviewed and updated as appropriate: allergies, current medications, past family history, past medical history, past social history, past surgical history and problem list.   Objective:  There were no vitals filed for this visit.  Fetal Status:        Presentation: Vertex  General:  Alert, oriented and cooperative. Patient is in no acute distress.  Skin: Skin is warm and dry. No rash noted.   Cardiovascular: Normal heart rate noted  Respiratory: Normal respiratory effort, no problems with respiration noted  Abdomen: Soft, gravid, appropriate for gestational age.        Pelvic: Cervical exam performed Dilation: 1 Effacement (%): 20 Station: -3  Extremities: Normal range of motion.     Mental Status: Normal mood and affect. Normal behavior. Normal judgment and thought content.   Assessment and Plan:  Pregnancy: [id number] at [redacted]w[redacted]d 1. Supervision of other normal pregnancy, antepartum Routine testing - GC/Chlamydia probe amp ([place])not at [facility] - Strep Gp B NAA  2. Non-English speaking patient   3. Gestational hypertension, third trimester IOL 37 weeks  Preterm labor symptoms and general obstetric precautions including but not limited to vaginal bleeding, contractions, leaking of fluid and fetal movement were reviewed in detail with the patient. Please refer to After Visit Summary for other counseling recommendations.   Return in about 5 weeks (around 12/07/2018) for PP visit.  IOL on 7/13.  Future Appointments  Date Time Provider [place]  11/02/2018  2:55 PM Woodroe Mode, MD [place]  11/09/2018  7:30 AM [facility] [place] None  Scheryl DarterJames Gildardo Tickner, MD

## 2018-11-02 NOTE — Telephone Encounter (Signed)
[id number] interpreter number  Preadmission screen

## 2018-11-03 ENCOUNTER — Encounter: Payer: Self-pay | Admitting: *Deleted

## 2018-11-04 LAB — GC/CHLAMYDIA PROBE AMP (~~LOC~~) NOT AT ARMC
Chlamydia: NEGATIVE
Neisseria Gonorrhea: NEGATIVE

## 2018-11-04 LAB — STREP GP B NAA: Strep Gp B NAA: NEGATIVE

## 2018-11-05 ENCOUNTER — Other Ambulatory Visit: Payer: Self-pay

## 2018-11-05 ENCOUNTER — Other Ambulatory Visit (HOSPITAL_COMMUNITY)
Admission: RE | Admit: 2018-11-05 | Discharge: 2018-11-05 | Disposition: A | Discharge status: Home / Self Care | Payer: HRSA Program | Source: Ambulatory Visit | Attending: Obstetrics and Gynecology | Admitting: Obstetrics and Gynecology

## 2018-11-05 DIAGNOSIS — Z1159 Encounter for screening for other viral diseases: Secondary | ICD-10-CM | POA: Insufficient documentation

## 2018-11-05 DIAGNOSIS — Z01812 Encounter for preprocedural laboratory examination: Secondary | ICD-10-CM | POA: Diagnosis not present

## 2018-11-05 NOTE — MAU Note (Signed)
Covid swab collected. Pt tolerated well. PT asymptomatic 

## 2018-11-06 LAB — SARS CORONAVIRUS 2 (TAT 6-24 HRS): SARS Coronavirus 2: NEGATIVE

## 2018-11-09 ENCOUNTER — Encounter (HOSPITAL_COMMUNITY): Payer: Self-pay | Admitting: *Deleted

## 2018-11-09 ENCOUNTER — Inpatient Hospital Stay (HOSPITAL_COMMUNITY)
Admission: AD | Admit: 2018-11-09 | Discharge: 2018-11-11 | DRG: 807 | Disposition: A | Discharge status: Home / Self Care | Payer: Medicaid Other | Attending: Obstetrics and Gynecology | Admitting: Obstetrics and Gynecology

## 2018-11-09 ENCOUNTER — Inpatient Hospital Stay (HOSPITAL_COMMUNITY): Payer: Medicaid Other

## 2018-11-09 DIAGNOSIS — O134 Gestational [pregnancy-induced] hypertension without significant proteinuria, complicating childbirth: Secondary | ICD-10-CM | POA: Diagnosis present

## 2018-11-09 DIAGNOSIS — O139 Gestational [pregnancy-induced] hypertension without significant proteinuria, unspecified trimester: Secondary | ICD-10-CM | POA: Diagnosis present

## 2018-11-09 DIAGNOSIS — Z3A37 37 weeks gestation of pregnancy: Secondary | ICD-10-CM | POA: Diagnosis not present

## 2018-11-09 DIAGNOSIS — Z789 Other specified health status: Secondary | ICD-10-CM

## 2018-11-09 DIAGNOSIS — F329 Major depressive disorder, single episode, unspecified: Secondary | ICD-10-CM | POA: Diagnosis present

## 2018-11-09 DIAGNOSIS — O0933 Supervision of pregnancy with insufficient antenatal care, third trimester: Secondary | ICD-10-CM

## 2018-11-09 DIAGNOSIS — F32A Depression, unspecified: Secondary | ICD-10-CM | POA: Diagnosis present

## 2018-11-09 LAB — CBC
HCT: 32.7 % — ABNORMAL LOW (ref 36.0–46.0)
Hemoglobin: 10.9 g/dL — ABNORMAL LOW (ref 12.0–15.0)
MCH: 28.5 pg (ref 26.0–34.0)
MCHC: 33.3 g/dL (ref 30.0–36.0)
MCV: 85.6 fL (ref 80.0–100.0)
Platelets: 219 10*3/uL (ref 150–400)
RBC: 3.82 MIL/uL — ABNORMAL LOW (ref 3.87–5.11)
RDW: 13.2 % (ref 11.5–15.5)
WBC: 8 10*3/uL (ref 4.0–10.5)
nRBC: 0 % (ref 0.0–0.2)

## 2018-11-09 LAB — COMPREHENSIVE METABOLIC PANEL
ALT: 13 U/L (ref 0–44)
AST: 20 U/L (ref 15–41)
Albumin: 3 g/dL — ABNORMAL LOW (ref 3.5–5.0)
Alkaline Phosphatase: 185 U/L — ABNORMAL HIGH (ref 38–126)
Anion gap: 10 (ref 5–15)
BUN: 13 mg/dL (ref 6–20)
CO2: 19 mmol/L — ABNORMAL LOW (ref 22–32)
Calcium: 9.1 mg/dL (ref 8.9–10.3)
Chloride: 107 mmol/L (ref 98–111)
Creatinine, Ser: 0.67 mg/dL (ref 0.44–1.00)
GFR calc Af Amer: >60 mL/min (ref >60)
GFR calc non Af Amer: >60 mL/min (ref >60)
Glucose, Bld: 89 mg/dL (ref 70–99)
Potassium: 3.7 mmol/L (ref 3.5–5.1)
Sodium: 136 mmol/L (ref 135–145)
Total Bilirubin: 0.5 mg/dL (ref 0.3–1.2)
Total Protein: 6.2 g/dL — ABNORMAL LOW (ref 6.5–8.1)

## 2018-11-09 LAB — TYPE AND SCREEN
ABO/RH(D): O POS
Antibody Screen: NEGATIVE

## 2018-11-09 LAB — ABO/RH: ABO/RH(D): O POS

## 2018-11-09 LAB — PROTEIN / CREATININE RATIO, URINE
Creatinine, Urine: 41.37 mg/dL
Protein Creatinine Ratio: 0.24 mg/mg{Cre} — ABNORMAL HIGH (ref 0.00–0.15)
Total Protein, Urine: 10 mg/dL

## 2018-11-09 LAB — RPR: RPR Ser Ql: NONREACTIVE

## 2018-11-09 MED ORDER — DIBUCAINE (PERIANAL) 1 % EX OINT: 1.0000 "application " | TOPICAL_OINTMENT | CUTANEOUS | Status: DC | PRN

## 2018-11-09 MED ORDER — ACETAMINOPHEN 325 MG PO TABS
650.0000 mg | ORAL_TABLET | ORAL | Status: DC | PRN
  Administered 2018-11-09: 650 mg via ORAL
  Filled 2018-11-09: qty 2

## 2018-11-09 MED ORDER — ZOLPIDEM TARTRATE 5 MG PO TABS: 5.0000 mg | ORAL_TABLET | Freq: Every evening | ORAL | Status: DC | PRN

## 2018-11-09 MED ORDER — PRENATAL MULTIVITAMIN CH
1.0000 | ORAL_TABLET | Freq: Every day | ORAL | Status: DC
  Administered 2018-11-10 – 2018-11-11 (×2): 1 via ORAL
  Filled 2018-11-09 (×2): qty 1

## 2018-11-09 MED ORDER — SOD CITRATE-CITRIC ACID 500-334 MG/5ML PO SOLN: 30.0000 mL | ORAL | Status: DC | PRN

## 2018-11-09 MED ORDER — WITCH HAZEL-GLYCERIN EX PADS: 1.0000 "application " | MEDICATED_PAD | CUTANEOUS | Status: DC | PRN

## 2018-11-09 MED ORDER — OXYCODONE-ACETAMINOPHEN 5-325 MG PO TABS: 2.0000 | ORAL_TABLET | ORAL | Status: DC | PRN

## 2018-11-09 MED ORDER — OXYTOCIN BOLUS FROM INFUSION
500.0000 mL | Freq: Once | INTRAVENOUS | Status: AC
  Administered 2018-11-09: 500 mL via INTRAVENOUS

## 2018-11-09 MED ORDER — IBUPROFEN 600 MG PO TABS
600.0000 mg | ORAL_TABLET | Freq: Four times a day (QID) | ORAL | Status: DC
  Administered 2018-11-10 – 2018-11-11 (×7): 600 mg via ORAL
  Filled 2018-11-09 (×7): qty 1

## 2018-11-09 MED ORDER — MEASLES, MUMPS & RUBELLA VAC IJ SOLR: 0.5000 mL | Freq: Once | INTRAMUSCULAR | Status: DC

## 2018-11-09 MED ORDER — DIPHENHYDRAMINE HCL 25 MG PO CAPS: 25.0000 mg | ORAL_CAPSULE | Freq: Four times a day (QID) | ORAL | Status: DC | PRN

## 2018-11-09 MED ORDER — ONDANSETRON HCL 4 MG PO TABS: 4.0000 mg | ORAL_TABLET | ORAL | Status: DC | PRN

## 2018-11-09 MED ORDER — FENTANYL CITRATE (PF) 100 MCG/2ML IJ SOLN
INTRAMUSCULAR | Status: AC
  Filled 2018-11-09: qty 2

## 2018-11-09 MED ORDER — COCONUT OIL OIL: 1.0000 "application " | TOPICAL_OIL | Status: DC | PRN

## 2018-11-09 MED ORDER — LIDOCAINE HCL (PF) 1 % IJ SOLN: 30.0000 mL | INTRAMUSCULAR | Status: DC | PRN

## 2018-11-09 MED ORDER — LACTATED RINGERS IV SOLN
INTRAVENOUS | Status: DC
  Administered 2018-11-09 (×2): via INTRAVENOUS

## 2018-11-09 MED ORDER — FENTANYL CITRATE (PF) 100 MCG/2ML IJ SOLN
100.0000 ug | INTRAMUSCULAR | Status: DC | PRN
  Administered 2018-11-09: 100 ug via INTRAVENOUS

## 2018-11-09 MED ORDER — OXYTOCIN 40 UNITS IN NORMAL SALINE INFUSION - SIMPLE MED
1.0000 m[IU]/min | INTRAVENOUS | Status: DC
  Administered 2018-11-09: 2 m[IU]/min via INTRAVENOUS

## 2018-11-09 MED ORDER — OXYTOCIN 40 UNITS IN NORMAL SALINE INFUSION - SIMPLE MED
2.5000 [IU]/h | INTRAVENOUS | Status: DC
  Filled 2018-11-09: qty 1000

## 2018-11-09 MED ORDER — SIMETHICONE 80 MG PO CHEW: 80.0000 mg | CHEWABLE_TABLET | ORAL | Status: DC | PRN

## 2018-11-09 MED ORDER — TERBUTALINE SULFATE 1 MG/ML IJ SOLN: 0.2500 mg | Freq: Once | INTRAMUSCULAR | Status: DC | PRN

## 2018-11-09 MED ORDER — ONDANSETRON HCL 4 MG/2ML IJ SOLN: 4.0000 mg | INTRAMUSCULAR | Status: DC | PRN

## 2018-11-09 MED ORDER — ONDANSETRON HCL 4 MG/2ML IJ SOLN: 4.0000 mg | Freq: Four times a day (QID) | INTRAMUSCULAR | Status: DC | PRN

## 2018-11-09 MED ORDER — OXYCODONE HCL 5 MG PO TABS: 10.0000 mg | ORAL_TABLET | ORAL | Status: DC | PRN

## 2018-11-09 MED ORDER — SENNOSIDES-DOCUSATE SODIUM 8.6-50 MG PO TABS
2.0000 | ORAL_TABLET | ORAL | Status: DC
  Administered 2018-11-10 (×2): 2 via ORAL
  Filled 2018-11-09 (×2): qty 2

## 2018-11-09 MED ORDER — BENZOCAINE-MENTHOL 20-0.5 % EX AERO: 1.0000 "application " | INHALATION_SPRAY | CUTANEOUS | Status: DC | PRN

## 2018-11-09 MED ORDER — OXYCODONE HCL 5 MG PO TABS: 5.0000 mg | ORAL_TABLET | ORAL | Status: DC | PRN

## 2018-11-09 MED ORDER — MISOPROSTOL 25 MCG QUARTER TABLET
25.0000 ug | ORAL_TABLET | ORAL | Status: DC | PRN
  Administered 2018-11-09: 25 ug via VAGINAL
  Filled 2018-11-09: qty 1

## 2018-11-09 MED ORDER — ACETAMINOPHEN 325 MG PO TABS
650.0000 mg | ORAL_TABLET | ORAL | Status: DC | PRN
  Administered 2018-11-09 – 2018-11-11 (×6): 650 mg via ORAL
  Filled 2018-11-09 (×7): qty 2

## 2018-11-09 MED ORDER — FLEET ENEMA 7-19 GM/118ML RE ENEM: 1.0000 | ENEMA | RECTAL | Status: DC | PRN

## 2018-11-09 MED ORDER — OXYCODONE-ACETAMINOPHEN 5-325 MG PO TABS: 1.0000 | ORAL_TABLET | ORAL | Status: DC | PRN

## 2018-11-09 MED ORDER — TETANUS-DIPHTH-ACELL PERTUSSIS 5-2.5-18.5 LF-MCG/0.5 IM SUSP: 0.5000 mL | Freq: Once | INTRAMUSCULAR | Status: DC

## 2018-11-09 MED ORDER — LACTATED RINGERS IV SOLN
500.0000 mL | INTRAVENOUS | Status: DC | PRN
  Administered 2018-11-09: 500 mL via INTRAVENOUS

## 2018-11-09 NOTE — Discharge Summary (Signed)
OB Discharge Summary     Patient Name: Connie Boyd DOB: 10-25-1985 MRN: [id number]  Date of admission: 11/09/2018 Delivering MD: Arvilla MarketWALLACE,  LAUREN   Date of discharge: 11/11/2018  Admitting diagnosis: pregnancy Intrauterine pregnancy: 8645w0d     Secondary diagnosis:  Active Problems:   Depression   Limited prenatal care in third trimester   Non-English speaking patient   Gestational hypertension   SVD (spontaneous vaginal delivery)  Additional problems: None      Discharge diagnosis: Term Pregnancy Delivered and Gestational Hypertension                                                                                                Post partum procedures:None  Augmentation: AROM, Pitocin and Cytotec  Complications: None  Hospital course:  Induction of Labor With Vaginal Delivery   33 y.o. yo [id number] at 5345w0d was admitted to the hospital 11/09/2018 for induction of labor.  Indication for induction: Gestational hypertension.  Patient had an uncomplicated labor course as follows: Membrane Rupture Time/Date: 3:28 PM ,11/09/2018   Intrapartum Procedures: Episiotomy: None [1]                                         Lacerations:  None [1]  Patient had delivery of a Viable infant.  Information for the patient's newborn:  Genevie AnnRamirez-Jasso, Girl Connie Boyd [[id number]][[id number]]  Delivery Method: Vaginal, Spontaneous(Filed from Delivery Summary)    11/09/2018  Details of delivery can be found in separate delivery note.  Patient had a routine postpartum course. Patient is discharged home 11/11/18. Discussed with patient need to monitor BP daily and call for BP >140/90. Also instructed to monitor for symptoms of Pre-Eclampsia and to come to MAU if she exhibited any of the symptoms.   Physical exam  Vitals:   11/10/18 0530 11/10/18 1600 11/10/18 2357 11/11/18 0527  BP: 133/84 (!) 148/78 136/85 132/79  Pulse: 84 74 74 71  Resp: 18 16 20 17   Temp: 97.8 F (36.6 C) 98.6 F (37 C) 97.9  F (36.6 C) 98 F (36.7 C)  TempSrc: Oral Oral Oral Oral  SpO2: 99%  99% 100%  Weight:      Height:       General: alert and cooperative Lochia: appropriate Uterine Fundus: firm Incision: N/A DVT Evaluation: No evidence of DVT seen on physical exam. Labs: Lab Results  Component Value Date   WBC 8.0 11/09/2018   HGB 10.9 (L) 11/09/2018   HCT 32.7 (L) 11/09/2018   MCV 85.6 11/09/2018   PLT 219 11/09/2018   CMP Latest Ref Rng & Units 11/09/2018  Glucose 70 - 99 mg/dL 89  BUN 6 - 20 mg/dL 13  Creatinine 1.300.44 - 8.651.00 mg/dL 7.840.67  Sodium 696135 - 295145 mmol/L 136  Potassium 3.5 - 5.1 mmol/L 3.7  Chloride 98 - 111 mmol/L 107  CO2 22 - 32 mmol/L 19(L)  Calcium 8.9 - 10.3 mg/dL 9.1  Total Protein 6.5 - 8.1 g/dL 6.2(L)  Total Bilirubin 0.3 -  1.2 mg/dL 0.5  Alkaline Phos 38 - 126 U/L 185(H)  AST 15 - 41 U/L 20  ALT 0 - 44 U/L 13    Discharge instruction: per After Visit Summary and "Baby and Me Booklet".  After visit meds:  Allergies as of 11/11/2018   No Known Allergies     Medication List    TAKE these medications   acetaminophen 325 MG tablet Commonly known as: TYLENOL Take 325 mg by mouth daily as needed for mild pain.   FOLIC ACID PO Take 1 tablet by mouth daily.   ibuprofen 600 MG tablet Commonly known as: ADVIL Take 1 tablet (600 mg total) by mouth every 6 (six) hours.   Magnesium 200 MG Tabs Take 1 tablet (200 mg total) by mouth at bedtime.   multivitamin-prenatal 27-0.8 MG Tabs tablet Take 1 tablet by mouth daily at 12 noon.   senna-docusate 8.6-50 MG tablet Commonly known as: Senokot-S Take 2 tablets by mouth daily. Start taking on: November 12, 2018       Diet: low salt diet  Activity: Advance as tolerated. Pelvic rest for 6 weeks.   Outpatient follow up:4 weeks Follow up Appt: Future Appointments  Date Time Provider [place]  12/07/2018  8:15 AM Lajean Manes, CNM [facility]   Follow up Visit:No follow-ups on file. Please schedule  this patient for Postpartum visit in: 4 weeks with the following provider: Any provider For C/S patients schedule nurse incision check in weeks 2 weeks: no High risk pregnancy complicated by: [name]  Delivery mode:  SVD Anticipated Birth Control:  OCPs PP Procedures needed: BP check  Schedule Integrated [place] visit: no   Postpartum contraception: Combination OCPs  Newborn Data: Live born female  Birth Weight:  3590g APGAR: 9, 9  Newborn Delivery   Birth date/time: 11/09/2018 18:08:00 Delivery type: Vaginal, Spontaneous      Baby Feeding: Bottle and Breast Disposition:home with mother   11/11/2018 Melina Schools, DO

## 2018-11-09 NOTE — H&P (Addendum)
LABOR AND DELIVERY ADMISSION HISTORY AND PHYSICAL NOTE  Connie Boyd is a 33 y.o. female [phone] with IUP at 1938w0d by LMP presenting for induction for gestational HTN. She reports positive fetal movement. She denies leakage of fluid or vaginal bleeding.  [facility]/Complications[facility] Pregnancy complications:  - [name] -h/o depression, no medications  -language barrier, needs Spanish interpreter   Past Medical History: Past Medical History:  Diagnosis Date  . Pregnancy induced hypertension     Past Surgical History: Past Surgical History:  Procedure Laterality Date  . APPENDECTOMY    . OVARIAN CYST REMOVAL      Obstetrical History: OB History    Gravida  5   Para  3   Term  3   Preterm      AB  1   Living  3     SAB  1   TAB      Ectopic      Multiple      Live Births  3           Social History: Social History   Socioeconomic History  . Marital status: Single    Spouse name: Not on file  . Number of children: Not on file  . Years of education: Not on file  . Highest education level: Not on file  Occupational History  . Not on file  Social Needs  . Financial resource strain: Not hard at all  . Food insecurity    Worry: Never true    Inability: Never true  . Transportation needs    Medical: No    Non-medical: Not on file  Tobacco Use  . Smoking status: Never Smoker  . Smokeless tobacco: Never Used  Substance and Sexual Activity  . Alcohol use: Yes    Alcohol/week: 1.0 standard drinks    Types: 1 Cans of beer per week  . Drug use: No  . Sexual activity: Yes    Birth control/protection: None  Lifestyle  . Physical activity    Days per week: Not on file    Minutes per session: Not on file  . Stress: Not on file  Relationships  . Social Musicianconnections    Talks on phone: Not on file    Gets together: Not on file    Attends religious service: Not on file    Active member of club or  organization: Not on file    Attends meetings of clubs or organizations: Not on file    Relationship status: Not on file  Other Topics Concern  . Not on file  Social History Narrative  . Not on file    Family History: Family History  Problem Relation Age of Onset  . Cancer Paternal Uncle   . Cancer Mother   . Diabetes Paternal Grandfather   . Anesthesia problems Neg Hx   . Other Neg Hx     Allergies: No Known Allergies  Medications Prior to Admission  Medication Sig Dispense Refill Last Dose  . acetaminophen (TYLENOL) 325 MG tablet Take 325 mg by mouth daily as needed for mild pain.   Past Week at Unknown time  . FOLIC ACID PO Take 1 tablet by mouth daily.   11/08/2018 at Unknown time  . Magnesium 200 MG TABS Take 1 tablet (200 mg total) by mouth at bedtime. 30 each 3 11/08/2018 at Unknown time  . Prenatal Vit-Fe Fumarate-FA (MULTIVITAMIN-PRENATAL) 27-0.8 MG TABS tablet Take 1 tablet by mouth daily at  12 noon.   11/08/2018 at Unknown time     Review of Systems  All systems reviewed and negative except as stated in HPI.  Physical Exam Blood pressure (!) 147/88, pulse 69, temperature 97.9 F (36.6 C), temperature source Oral, resp. rate 18, height 5\' 1"  (1.549 m), weight 77.7 kg, last menstrual period 02/23/2018, currently breastfeeding. General appearance: alert, oriented, NAD Lungs: normal respiratory effort Heart: regular rate Abdomen: soft, non-tender; gravid, FH appropriate for GA Extremities: no calf swelling or tenderness Presentation: cephalic Fetal monitoring: cat 1 Uterine activity: contractions every 2-3 minutes Dilation: 4 Effacement (%): 70 Station: -3 Exam by:: Welford RocheJessica Thornton, RNC  Prenatal labs: ABO, Rh: --/--/O POS, O POS (07/13 0800) Antibody: NEG (07/13 0800) Rubella: 6.18 (02/03 1522) RPR: Non Reactive (05/22 0826)  HBsAg: Negative (02/03 1522)  HIV: Non Reactive (05/22 0826)  GC/Chlamydia: Negative GBS: Negative (07/06 1511)  2-hr GTT:  Normal Genetic screening: Declined Anatomy US: Completed at health department; normal with limited views of spine, follow up US not obtained  Prenatal Transfer Tool  Maternal Diabetes: No Genetic Screening: Declined Maternal Ultrasounds/Referrals: Normal with limited views of spine; follow up US not obtained Fetal Ultrasounds or other Referrals:  None Maternal Substance Abuse:  No Significant Maternal Medications:  None Significant Maternal Lab Results: None  Results for orders placed or performed during the hospital encounter of 11/09/18 (from the past 24 hour(s))  CBC   Collection Time: 11/09/18  7:34 AM  Result Value Ref Range   WBC 8.0 4.0 - 10.5 K/uL   RBC 3.82 (L) 3.87 - 5.11 MIL/uL   Hemoglobin 10.9 (L) 12.0 - 15.0 g/dL   HCT 40.932.7 (L) 81.136.0 - 91.446.0 %   MCV 85.6 80.0 - 100.0 fL   MCH 28.5 26.0 - 34.0 pg   MCHC 33.3 30.0 - 36.0 g/dL   RDW 78.213.2 95.611.5 - 21.315.5 %   Platelets 219 150 - 400 K/uL   nRBC 0.0 0.0 - 0.2 %  Comprehensive metabolic panel   Collection Time: 11/09/18  7:34 AM  Result Value Ref Range   Sodium 136 135 - 145 mmol/L   Potassium 3.7 3.5 - 5.1 mmol/L   Chloride 107 98 - 111 mmol/L   CO2 19 (L) 22 - 32 mmol/L   Glucose, Bld 89 70 - 99 mg/dL   BUN 13 6 - 20 mg/dL   Creatinine, Ser 0.860.67 0.44 - 1.00 mg/dL   Calcium 9.1 8.9 - 57.810.3 mg/dL   Total Protein 6.2 (L) 6.5 - 8.1 g/dL   Albumin 3.0 (L) 3.5 - 5.0 g/dL   AST 20 15 - 41 U/L   ALT 13 0 - 44 U/L   Alkaline Phosphatase 185 (H) 38 - 126 U/L   Total Bilirubin 0.5 0.3 - 1.2 mg/dL   GFR calc non Af Amer >60 >60 mL/min   GFR calc Af Amer >60 >60 mL/min   Anion gap 10 5 - 15  Type and screen [facility]   Collection Time: 11/09/18  8:00 AM  Result Value Ref Range   ABO/RH(D) O POS    Antibody Screen NEG    Sample Expiration      11/12/2018,2359 Performed at [facility], [address] [place], [place] [id number]   ABO/Rh   Collection Time: 11/09/18  8:00 AM  Result Value Ref Range    ABO/RH(D) O POS    No rh immune globuloin      NOT A RH IMMUNE GLOBULIN CANDIDATE,  PT RH POSITIVE Performed at [facility], [place] [address], [place], [place] [address]   Protein / creatinine ratio, urine   Collection Time: 11/09/18 10:33 AM  Result Value Ref Range   Creatinine, Urine 41.37 mg/dL   Total Protein, Urine 10 mg/dL   Protein Creatinine Ratio 0.24 (H) 0.00 - 0.15 mg/mg[Cre]    Patient Active Problem List   Diagnosis Date Noted  . Non-English speaking patient 10/29/2018  . Gestational hypertension 10/29/2018  . Limited prenatal care in third trimester 10/15/2018  . Supervision of other normal pregnancy, antepartum 06/01/2018  . Depression 11/20/2011    Assessment: Connie Boyd is a 33 y.o. [id number] at [redacted]w[redacted]d here for IOL secondary to [name].  #Labor:  IOL s/p Cytotec; plan for additional augmentation with Pitocin as needed #Pain: Desires IV pain medicine PRN; well-controlled at this time #FWB:  Category 1 tracing; good fetal movement #ID:  No antibiotics needed; GBS negative #MOF:  Breast and bottle feeding #MOC:  [place] 11/09/2018, 12:34 PM   OB FELLOW HISTORY AND PHYSICAL ATTESTATION  I have seen and examined this patient; I agree with above documentation in the resident's note.   Phill Myron, D.O. OB Fellow  11/09/2018, 12:38 PM

## 2018-11-09 NOTE — Progress Notes (Signed)
I was present during the delivery with Dr Juleen China and Dr Gwenlyn Perking, by Juliann Mule Spanish Interpreter.

## 2018-11-09 NOTE — Progress Notes (Addendum)
LABOR PROGRESS NOTE  Connie Boyd is a 33 y.o. [id number] at [redacted]w[redacted]d  admitted for IOL secondary to [place].  Subjective: Patient is doing well and breathing through painful contractions. No additional concerns at this time.  Objective: BP (!) 153/107   Pulse 77   Temp 98.5 F (36.9 C) (Oral)   Resp 20   Ht 5\' 1"  (1.549 m)   Wt 77.7 kg   LMP 02/23/2018   BMI 32.35 kg/m  or  Vitals:   11/09/18 1301 11/09/18 1302 11/09/18 1331 11/09/18 1533  BP:  135/73 (!) 151/81 (!) 153/107  Pulse:  74 70 77  Resp: 16   20  Temp:  98.2 F (36.8 C)  98.5 F (36.9 C)  TempSrc:    Oral  Weight:      Height:        Dilation: 5 Effacement (%): 70 Station: 0 Presentation: Vertex Exam by:: Epifanio Lesches, RNC FHT: Baseline rate 135, moderate varibility, +accels, occasional variable/early decels Toco: Contractions q2 min  Labs: Lab Results  Component Value Date   WBC 8.0 11/09/2018   HGB 10.9 (L) 11/09/2018   HCT 32.7 (L) 11/09/2018   MCV 85.6 11/09/2018   PLT 219 11/09/2018    Patient Active Problem List   Diagnosis Date Noted  . Non-English speaking patient 10/29/2018  . Gestational hypertension 10/29/2018  . Limited prenatal care in third trimester 10/15/2018  . Supervision of other normal pregnancy, antepartum 06/01/2018  . Depression 11/20/2011    Assessment / Plan: 33 y.o. Connie Boyd at [redacted]w[redacted]d here for IOL secondary to [name].  Labor: Progressing well with appropriate cervical change and regular contractions. S/p Cytotec x1 and on Pitocin. AROM at 1530. Fetal Wellbeing: Category 2 as above Pain Control:  None; considering IV pain medication Anticipated MOD:  SVD  Vilma Meckel, MD Family Medicine, PGY-2 11/09/2018, 4:16 PM

## 2018-11-10 ENCOUNTER — Other Ambulatory Visit: Payer: Self-pay

## 2018-11-10 ENCOUNTER — Encounter: Payer: Self-pay | Admitting: Obstetrics and Gynecology

## 2018-11-10 NOTE — Lactation Note (Signed)
This note was copied from a baby's chart. Lactation Consultation Note Used [employer] Spanish Interpreter "Dannielle Burn" [phone] Mom states BF going well. Mom is BF/Formula feeding. Mom has 13,10, and 34 yr old that she BF 2-3 months BF and formula feeding then changing to all formula feeding. Mom denies painful latches. Baby is at breast while LC in room. Mom has short shaft compressible nipples that evert more w/stimulation. Encouraged mom to stimulate nipple prior to latching.  Mom is feeding in cradle position. Mom can express colostrum. Encouraged mom to BF before giving formula, massage breast during feeding at intervals, STS, I&O. Discussed newborn feeding habits, milk supply, positioning and support during feedings. Encouraged mom to call for questions or assistance.  Lactation brochure given in [place].  Patient Name: Connie Boyd [id number] Date: 11/10/2018 Reason for consult: Initial assessment;Early term 37-38.6wks   Maternal Data Has patient been taught Hand Expression?: Yes Does the patient have breastfeeding experience prior to this delivery?: Yes  Feeding Feeding Type: Breast Fed Nipple Type: Slow - flow  LATCH Score Latch: Grasps breast easily, tongue down, lips flanged, rhythmical sucking.  Audible Swallowing: A few with stimulation  Type of Nipple: Everted at rest and after stimulation  Comfort (Breast/Nipple): Soft / non-tender  Hold (Positioning): No assistance needed to correctly position infant at breast.  LATCH Score: 9  Interventions Interventions: Breast feeding basics reviewed;Skin to skin;Breast massage;Breast compression;Hand express  Lactation Tools Discussed/Used WIC Program: No   Consult Status Consult Status: Follow-up Date: 11/11/18 Follow-up type: In-patient    Connie Boyd, Elta [place] 11/10/2018, 3:03 AM

## 2018-11-10 NOTE — Progress Notes (Signed)
MOB was referred for history of depression/anxiety. * Referral screened out by Clinical Social Worker because none of the following criteria appear to apply: ~ History of anxiety/depression during this pregnancy, or of post-partum depression following prior delivery. ~ Diagnosis of anxiety and/or depression within last 3 years. Per further chart review, it appears that MOB was diagnosed with depression in 2013.  OR * MOB's symptoms currently being treated with medication and/or therapy.   Please contact the Clinical Social Worker if needs arise, by MOB request, or if MOB scores greater than 9/yes to question 10 on Edinburgh Postpartum Depression Screen.   Aidan Caloca S. Patra Gherardi, MSW, LCSW [facility] [phone]    

## 2018-11-10 NOTE — Lactation Note (Signed)
This note was copied from a baby's chart. Lactation Consultation Note  Patient Name: Connie Boyd [id number] Date: 11/10/2018 Reason for consult: Follow-up assessment   I followed up with Ms. Ramirez-Jasso. Interpretor: Angelica Chessman.  Mom has been primarily bottle feeding baby. She states that baby is latching well, and she states that she is doing some breast feeding. I reviewed feeding frequency, and I recommended that she feed baby on demand. Baby was cueing while in the room, and mom was holding her in the swaddle.   I educated on the importance of frequent stimulation and breast milk removal for milk production, and I encouraged her to offer the breast prior to giving supplementation.  Ms. Frederico Hamman had no questions or concerns at this time. She declined assistance with latching baby at this time.   Maternal Data Does the patient have breastfeeding experience prior to this delivery?: Yes  Feeding Feeding Type: Bottle Fed - Formula   Interventions Interventions: Breast feeding basics reviewed  Lactation Tools Discussed/Used     Consult Status Consult Status: Follow-up Date: 11/11/18 Follow-up type: In-patient    Lenore Manner 11/10/2018, 5:45 PM

## 2018-11-10 NOTE — Progress Notes (Signed)
Patient ID: Connie Boyd, female   DOB: [birth year], 33 y.o.   MRN: [id number]  POSTPARTUM PROGRESS NOTE  Post Partum Day 1  Subjective:  Connie Boyd is a 33 y.o. [id number] s/p SVD at [redacted]w[redacted]d.  No acute events overnight.  Pt denies problems with ambulating, voiding or po intake.  She denies nausea or vomiting.  Pain is well controlled.  She has had flatus.  Lochia Small. No HA or scotoma.   Objective: Blood pressure 133/84, pulse 84, temperature 97.8 F (36.6 C), temperature source Oral, resp. rate 18, height 5\' 1"  (1.549 m), weight 77.7 kg, last menstrual period 02/23/2018, SpO2 99 %, unknown if currently breastfeeding.  Physical Exam:  General: alert, cooperative and no distress Chest: no respiratory distress Heart:regular rate, distal pulses intact Abdomen: soft, nontender,  Uterine Fundus: firm, appropriately tender DVT Evaluation: No calf swelling or tenderness Extremities: mild edema, +1 pitting Skin: warm, dry  Recent Labs    11/09/18 0734  HGB 10.9*  HCT 32.7*    Assessment/Plan: Connie Boyd is a 33 y.o. [id number] s/p SVD at [redacted]w[redacted]d   PPD# 1- Doing well Contraception: POPs Feeding: Breast  Dispo: Plan for discharge tomorrow 11/11/2018 # continue to monitor BP.    LOS: 1 day   Rasch, Artist Pais, NP, CNM 11/10/2018, 11:18 AM

## 2018-11-11 DIAGNOSIS — Z3A37 37 weeks gestation of pregnancy: Secondary | ICD-10-CM

## 2018-11-11 DIAGNOSIS — O134 Gestational [pregnancy-induced] hypertension without significant proteinuria, complicating childbirth: Secondary | ICD-10-CM

## 2018-11-11 LAB — BIRTH TISSUE RECOVERY COLLECTION (PLACENTA DONATION)

## 2018-11-11 MED ORDER — SENNOSIDES-DOCUSATE SODIUM 8.6-50 MG PO TABS: 2.0000 | ORAL_TABLET | ORAL | 0 refills | Status: DC

## 2018-11-11 MED ORDER — IBUPROFEN 600 MG PO TABS: 600.0000 mg | ORAL_TABLET | Freq: Four times a day (QID) | ORAL | 1 refills | Status: DC

## 2018-11-11 NOTE — Lactation Note (Signed)
This note was copied from a baby's chart. Lactation Consultation Note  Patient Name: Connie Boyd Date: 11/11/2018   Leretha Dykes, RN was notified there did not seem to be a need for Lactation to visit this mother today. RN in agreement.   Matthias Hughs [facility] 11/11/2018, 8:43 AM

## 2018-11-11 NOTE — Plan of Care (Signed)
  Problem: Education: Goal: Knowledge of General Education information will improve Description: Including pain rating scale, medication(s)/side effects and non-pharmacologic comfort measures Outcome: Completed/Met Note: Attempted to use in house interpreter; however, she was busy. Used stratus interpreter "Asencion Partridge [phone]" for discharge education on mother and baby. All questions answered and patient ready to be discharged. Maxwell Caul, Leretha Dykes [place]

## 2018-11-11 NOTE — Discharge Instructions (Signed)
Parto vaginal, cuidados de puerperio Postpartum Care After Vaginal Delivery Lea esta informacin sobre cmo cuidarse desde el momento en que nazca su beb y hasta 6 a 12 semanas despus del parto (perodo del posparto). El mdico tambin podr darle instrucciones ms especficas. Comunquese con su mdico si tiene problemas o preguntas. Siga estas indicaciones en su casa: Hemorragia vaginal  Es normal tener un poco de hemorragia vaginal (loquios) despus del parto. Use un apsito sanitario para el sangrado vaginal y secrecin. ? Durante la primera semana despus del parto, la cantidad y el aspecto de los loquios a menudo es similar a las del perodo menstrual. ? Durante las siguientes semanas disminuir gradualmente hasta convertirse en una secrecin seca amarronada o amarillenta. ? En la mayora de las mujeres, los loquios se detienen completamente entre 4 a 6semanas despus del parto. Los sangrados vaginales pueden variar de mujer a mujer.  Cambie los apsitos sanitarios con frecuencia. Observe si hay cambios en el flujo, como: ? Un aumento repentino en el volumen. ? Cambio en el color. ? Cogulos sanguneos grandes.  Si expulsa un cogulo de sangre por la vagina, gurdelo y llame al mdico para informrselo. No deseche los cogulos de sangre por el inodoro antes de hablar con su mdico.  No use tampones ni se haga duchas vaginales hasta que el mdico la autorice.  Si no est amamantando, volver a tener su perodo entre 6 y 8 semanas despus del parto. Si solamente alimenta al beb con leche materna (lactancia materna exclusiva), podra no volver a tener su perodo hasta que deje de amamantar. Cuidados perineales  Mantenga la zona entre la vagina y el ano (perineo) limpia y seca, como se lo haya indicado el mdico. Utilice apsitos o aerosoles analgsicos y cremas, como se lo hayan indicado.  Si le hicieron un corte en el perineo (episiotoma) o tuvo un desgarro en la vagina, controle la  zona para detectar signos de infeccin hasta que sane. Est atenta a los siguientes signos: ? Aumento del enrojecimiento, la hinchazn o el dolor. ? Presenta lquido o sangre que supura del corte o desgarro. ? Calor. ? Pus o mal olor.  Es posible que le den una botella rociadora para que use en lugar de limpiarse el rea con papel higinico despus de usar el bao. Cuando comience a sanar, podr usar la botella rociadora antes de secarse. Asegrese de secarse suavemente.  Para aliviar el dolor causado por una episiotoma, un desgarro en la vagina o venas hinchadas en el ano (hemorroides), trate de tomar un bao de asiento tibio 2 o 3 veces por da. Un bao de asiento es un bao de agua tibia que se toma mientras se est sentado. El agua solo debe llegar hasta las caderas y cubrir las nalgas. Cuidado de las mamas  En los primeros das despus del parto, las mamas pueden sentirse pesadas, llenas e incmodas (congestin mamaria). Tambin puede escaparse leche de sus senos. El mdico puede sugerirle mtodos para aliviar este malestar. La congestin mamaria debera desaparecer al cabo de unos das.  Si est amamantando: ? Use un sostn que sujete y ajuste bien sus pechos. ? Mantenga los pezones secos y limpios. Aplquese cremas y ungentos, como se lo haya indicado el mdico. ? Es posible que deba usar discos de algodn en el sostn para absorber la leche que se filtre de sus senos. ? Puede tener contracciones uterinas cada vez que amamante durante varias semanas despus del parto. Las contracciones uterinas ayudan al tero a   regresar a su tamao habitual. ? Si tiene algn problema con la lactancia materna, colabore con el mdico o un asesor en lactancia.  Si no est amamantando: ? Evite tocarse mucho las mamas. Al hacerlo, podran producir ms leche. ? Use un sostn que le proporcione el ajuste correcto y compresas fras para reducir la hinchazn. ? No extraiga (saque) leche materna. Esto har que  produzca ms leche. Intimidad y sexualidad  Pregntele al mdico cundo puede retomar la actividad sexual. Esto puede depender de lo siguiente: ? Su riesgo de sufrir infecciones. ? La rapidez con la que est sanando. ? Su comodidad y deseo de retomar la actividad sexual.  Despus del parto, puede quedar embarazada incluso si no ha tenido todava su perodo. Si lo desea, hable con el mdico acerca de los mtodos de control de la natalidad (mtodos anticonceptivos). Medicamentos  Tome los medicamentos de venta libre y los recetados solamente como se lo haya indicado el mdico.  Si le recetaron un antibitico, tmelo como se lo haya indicado el mdico. No deje de tomar el antibitico aunque comience a sentirse mejor. Actividad  Retome sus actividades normales de a poco como se lo haya indicado el mdico. Pregntele al mdico qu actividades son seguras para usted.  Descanse todo lo que pueda. Trate de descansar o tomar una siesta mientras el beb duerme. Comida y bebida   Beba suficiente lquido como para mantener la orina de color amarillo plido.  Coma alimentos ricos en fibras todos los das. Estos pueden ayudarla a prevenir o aliviar el estreimiento. Los alimentos ricos en fibras incluyen, entre otros: ? Panes y cereales integrales. ? Arroz integral. ? Frijoles. ? Frutas y verduras frescas.  No intente perder de peso rpidamente reduciendo el consumo de caloras.  Tome sus vitaminas prenatales hasta la visita de seguimiento de posparto o hasta que su mdico le indique que puede dejar de tomarlas. Estilo de vida  No consuma ningn producto que contenga nicotina o tabaco, como cigarrillos y cigarrillos electrnicos. Si necesita ayuda para dejar de fumar, consulte al mdico.  No beba alcohol, especialmente si est amamantando. Instrucciones generales  Concurra a todas las visitas de seguimiento para usted y el beb, como se lo haya indicado el mdico. La mayora de las mujeres  visita al mdico para un seguimiento de posparto dentro de las primeras 3 a 6 semanas despus del parto. Comunquese con un mdico si:  Se siente incapaz de controlar los cambios que implica tener un hijo y esos sentimientos no desaparecen.  Siente tristeza o preocupacin de forma inusual.  Las mamas se ponen rojas, le duelen o se endurecen.  Tiene fiebre.  Tiene dificultad para retener la orina o para impedir que la orina se escape.  Tiene poco inters o falta de inters en actividades que solan gustarle.  No ha amamantado nada y no ha tenido un perodo menstrual durante 12 semanas despus del parto.  Dej de amamantar al beb y no ha tenido su perodo menstrual durante 12 semanas despus de dejar de amamantar.  Tiene preguntas sobre su cuidado y el del beb.  Elimina un cogulo de sangre grande por la vagina. Solicite ayuda de inmediato si:  Siente dolor en el pecho.  Tiene dificultad para respirar.  Tiene un dolor repentino e intenso en la pierna.  Tiene dolor intenso o clicos en el la parte inferior del abdomen.  Tiene una hemorragia tan intensa de la vagina que empapa ms de un apsito en una hora. El sangrado   no debe ser ms abundante que el perodo ms intenso que haya tenido.  Dolor de cabeza intenso.  Se desmaya.  Tiene visin borrosa o manchas en la vista.  Tiene secrecin vaginal con mal olor.  Tiene pensamientos acerca de lastimarse a usted misma o a su beb. Si alguna vez siente que puede lastimarse a usted misma o a otras personas, o tiene pensamientos de poner fin a su vida, busque ayuda de inmediato. Puede dirigirse al departamento de emergencias ms cercano o llamar a:  El servicio de emergencias de su localidad (911 en [place]  Una lnea de asistencia al suicida y atencin en crisis, como la Lnea Nacional de Prevencin del Suicidio (National Suicide Prevention Lifeline), al 1-800-273-8255. Est disponible las 24 horas del da. Resumen  El  perodo de tiempo justo despus el parto y hasta 6 a 12 semanas despus del parto se denomina perodo posparto.  Retome sus actividades normales de a poco como se lo haya indicado el mdico.  Concurra a todas las visitas de seguimiento para usted y el beb, como se lo haya indicado el mdico. Esta informacin no tiene como fin reemplazar el consejo del mdico. Asegrese de hacerle al mdico cualquier pregunta que tenga. Document Released: 02/10/2007 Document Revised: 07/26/2017 Document Reviewed: 04/06/2017 Elsevier Patient Education  2020 Elsevier Inc.  

## 2018-11-19 ENCOUNTER — Telehealth: Payer: Self-pay | Admitting: Family Medicine

## 2018-11-19 NOTE — Progress Notes (Signed)
Patient ID: Connie Boyd, female   DOB: 03/15/1986, 33 y.o.   MRN: [id number] Patient seen and assessed by nursing staff during this encounter. I have reviewed the chart and agree with the documentation and plan.  Emeterio Reeve, MD 11/19/2018 11:08 AM

## 2018-11-19 NOTE — Telephone Encounter (Signed)
Spoke with patient w/ Spanish interpreter Mariel to get her scheduled for a BP check. Patient was scheduled for 7/27 @ 3:20. Patient instructed to wear a face mask for the entire appointment and no visitors are allowed. PAtient screened for covid symptoms and denied having any.

## 2018-11-23 ENCOUNTER — Other Ambulatory Visit: Payer: Self-pay

## 2018-11-23 ENCOUNTER — Ambulatory Visit (INDEPENDENT_AMBULATORY_CARE_PROVIDER_SITE_OTHER): Payer: Self-pay | Admitting: General Practice

## 2018-11-23 VITALS — BP 128/76 | HR 62 | Wt 149.0 lb

## 2018-11-23 DIAGNOSIS — Z712 Person consulting for explanation of examination or test findings: Secondary | ICD-10-CM

## 2018-11-23 DIAGNOSIS — Z0130 Encounter for examination of blood pressure without abnormal findings: Secondary | ICD-10-CM

## 2018-11-23 NOTE — Progress Notes (Signed)
Patient presents to office today for blood pressure check following vaginal delivery on 7/13. Patient did have gestational hypertension during pregnancy- was not sent home from the hospital on any medication. Patient denies headaches, dizziness or blurry vision. BP today 128/76. Patient will follow up at postpartum visit on 8/10.  Koren Bound RN BSN 11/23/18

## 2018-12-07 ENCOUNTER — Encounter: Payer: Self-pay | Admitting: Obstetrics and Gynecology

## 2018-12-07 ENCOUNTER — Ambulatory Visit (INDEPENDENT_AMBULATORY_CARE_PROVIDER_SITE_OTHER): Payer: Self-pay | Admitting: Obstetrics and Gynecology

## 2018-12-07 ENCOUNTER — Other Ambulatory Visit: Payer: Self-pay

## 2018-12-07 ENCOUNTER — Ambulatory Visit: Payer: Self-pay | Admitting: Certified Nurse Midwife

## 2018-12-07 DIAGNOSIS — Z789 Other specified health status: Secondary | ICD-10-CM

## 2018-12-07 DIAGNOSIS — O133 Gestational [pregnancy-induced] hypertension without significant proteinuria, third trimester: Secondary | ICD-10-CM

## 2018-12-07 MED ORDER — NORETHINDRONE 0.35 MG PO TABS: 1.0000 | ORAL_TABLET | Freq: Every day | ORAL | 11 refills | Status: DC

## 2018-12-07 NOTE — Progress Notes (Signed)
TELEHEALTH POSTPARTUM VIRTUAL TELEPHONE VISIT ENCOUNTER NOTE  Provider location: [facility] for [facility]   I connected with Connie Boyd on 12/07/18 at  3:55 PM EDT by Telephone Encounter at home and verified that I am speaking with the correct person using two identifiers.    I discussed the limitations, risks, security and privacy concerns of performing an evaluation and management service virtually and the availability of in person appointments. I also discussed with the patient that there may be a patient responsible charge related to this service. The patient expressed understanding and agreed to proceed.  Chief Complaint: Postpartum Visit  History of Present Illness: Connie Boyd is a 33 y.o. Hispanic [id number] being evaluated for postpartum followup.    She is s/p normal spontaneous vaginal delivery on 11/09/18 at 37 weeks; she was discharged to home on PPD#2. Pregnancy complicated by gestational hypertension, for which she was induced. Baby is doing well.  Complains of nothing, overall doing very well.  Vaginal bleeding or discharge: No  Intercourse: No  Contraception: oral progesterone-only contraceptive Mode of feeding infant: Breast PP depression s/s: No .  Any bowel or bladder issues: No  Pap smear: no abnormalities (date: 05/2018)  Review of Systems: Positive for n/a. Her 12 point review of systems is negative or as noted in the History of Present Illness.  Patient Active Problem List   Diagnosis Date Noted  . SVD (spontaneous vaginal delivery) 11/09/2018  . Non-English speaking patient 10/29/2018  . Gestational hypertension 10/29/2018  . Limited prenatal care in third trimester 10/15/2018  . Supervision of other normal pregnancy, antepartum 06/01/2018  . Depression 11/20/2011    Medications Reema D. [id number] had no medications administered during this visit. Current Outpatient Medications  Medication Sig Dispense  Refill  . acetaminophen (TYLENOL) 325 MG tablet Take 325 mg by mouth daily as needed for mild pain.    Marland Kitchen FOLIC ACID PO Take 1 tablet by mouth daily.    Marland Kitchen ibuprofen (ADVIL) 600 MG tablet Take 1 tablet (600 mg total) by mouth every 6 (six) hours. 60 tablet 1  . Magnesium 200 MG TABS Take 1 tablet (200 mg total) by mouth at bedtime. 30 each 3  . Prenatal Vit-Fe Fumarate-FA (MULTIVITAMIN-PRENATAL) 27-0.8 MG TABS tablet Take 1 tablet by mouth daily at 12 noon.    . norethindrone (MICRONOR) 0.35 MG tablet Take 1 tablet (0.35 mg total) by mouth daily. 1 Package 11  . senna-docusate (SENOKOT-S) 8.6-50 MG tablet Take 2 tablets by mouth daily. (Patient not taking: Reported on 12/07/2018) 20 tablet 0   No current facility-administered medications for this visit.     Allergies Patient has no known allergies.  Physical Exam:  LMP 02/23/2018  140/83 General:  Alert, oriented and cooperative. Patient is in no acute distress.  Mental Status: Normal mood and affect. Normal behavior. Normal judgment and thought content.   Respiratory: Normal respiratory effort noted, no problems with respiration noted  Rest of physical exam deferred due to type of encounter  PP Depression Screening:   Edinburgh Postnatal Depression Scale Screening Tool 12/07/2018 11/10/2018 11/09/2018  I have been able to laugh and see the funny side of things. 0 0 (No Data)  I have looked forward with enjoyment to things. 0 0 -  I have blamed myself unnecessarily when things went wrong. 0 0 -  I have been anxious or worried for no good reason. 0 0 -  I have felt scared or panicky for  no good reason. 0 0 -  Things have been getting on top of me. 0 0 -  I have been so unhappy that I have had difficulty sleeping. 0 0 -  I have felt sad or miserable. 0 0 -  I have been so unhappy that I have been crying. 0 0 -  The thought of harming myself has occurred to me. 0 0 -  Edinburgh Postnatal Depression Scale Total 0 0 -     Assessment:Patient  is a 33 y.o. [id number] who is 4 weeks postpartum from a normal spontaneous vaginal delivery. She is doing well.   Plan:  1. Gestational hypertension, third trimester Very mildly elevated today, no meds Referral to PCP for follow up  2. Non-English speaking patient Engineer, structuralpanish translator used  3. SVD (spontaneous vaginal delivery) Doing well Desires OCPs for contraception, reviewed would not recommend combo OCP for gestational hypertension, would recommend POP only, reviewed risks of stroke/clot, she is agreeable to start POP, sent to pharmacy   RTC 6 months or as needed  I discussed the assessment and treatment plan with the patient. The patient was provided an opportunity to ask questions and all were answered. The patient agreed with the plan and demonstrated an understanding of the instructions.   The patient was advised to call back or seek an in-person evaluation/go to the ED for any concerning postpartum symptoms.  I provided 15 minutes of face-to-face time during this encounter.   Conan BowensKelly M Richetta Cubillos, MD [facility], [facility]

## 2018-12-07 NOTE — Progress Notes (Signed)
1555-attempted to call pt. Pt answered and then the call disconnected. Attempted to call back and LVM informing the pt of attempt. Will call back.   I connected with  Connie Boyd on 12/07/18 at 16:18 by telephone and verified that I am speaking with the correct person using two identifiers. [place] interpreter used ID [id number].   I discussed the limitations, risks, security and privacy concerns of performing an evaluation and management service by telephone and the availability of in person appointments. I also discussed with the patient that there may be a patient responsible charge related to this service. The patient expressed understanding and agreed to proceed.  Loma Sousa, RN

## 2019-07-14 ENCOUNTER — Telehealth: Payer: Self-pay | Admitting: Family Medicine

## 2019-07-14 NOTE — Telephone Encounter (Signed)
Called with the in office interpreter Eda to inform of the upcoming appointment. The patient verbalized understanding.

## 2019-08-10 ENCOUNTER — Ambulatory Visit (INDEPENDENT_AMBULATORY_CARE_PROVIDER_SITE_OTHER): Payer: Self-pay | Admitting: Nurse Practitioner

## 2019-08-10 ENCOUNTER — Other Ambulatory Visit: Payer: Self-pay

## 2019-08-10 ENCOUNTER — Encounter: Payer: Self-pay | Admitting: Nurse Practitioner

## 2019-08-10 VITALS — BP 126/77 | HR 68 | Ht 60.0 in | Wt 157.2 lb

## 2019-08-10 DIAGNOSIS — Z30011 Encounter for initial prescription of contraceptive pills: Secondary | ICD-10-CM

## 2019-08-10 DIAGNOSIS — G5603 Carpal tunnel syndrome, bilateral upper limbs: Secondary | ICD-10-CM

## 2019-08-10 MED ORDER — NORGESTIMATE-ETH ESTRADIOL 0.25-35 MG-MCG PO TABS: 1.0000 | ORAL_TABLET | Freq: Every day | ORAL | 2 refills | Status: DC

## 2019-08-10 NOTE — Progress Notes (Signed)
   GYNECOLOGY OFFICE VISIT NOTE   History:  34 y.o. [id number] here today for pill recheck. She was on progestin only pills while breastfeeding, but now she only breastfeeds once a day and is not planning to continue.  She denies any abnormal vaginal discharge, bleeding, pelvic pain.  She is complaining of pain in hands and fingers daily.  Worse in the morning.  Past Medical History:  Diagnosis Date  . Pregnancy induced hypertension     Past Surgical History:  Procedure Laterality Date  . APPENDECTOMY    . OVARIAN CYST REMOVAL      The following portions of the patient's history were reviewed and updated as appropriate: allergies, current medications, past family history, past medical history, past social history, past surgical history and problem list.   Health Maintenance:  Normal pap and negative HRHPV on 06-01-2018.     Review of Systems:  Pertinent items noted in HPI and remainder of comprehensive ROS otherwise negative.  Objective:  Physical Exam BP 126/77   Pulse 68   Ht 5' (1.524 m)   Wt 157 lb 3.2 oz (71.3 kg)   LMP 07/21/2019 (Approximate)   BMI 30.70 kg/m  CONSTITUTIONAL: Well-developed, well-nourished female in no acute distress.  HENT:  Normocephalic, atraumatic. External right and left ear normal.  EYES: Conjunctivae and EOM are normal. Pupils are equal, round.  No scleral icterus.  NECK: Normal range of motion, supple SKIN: Skin is warm and dry. No rash noted. Not diaphoretic. No erythema. No pallor. NEUROLOGIC: Alert and oriented to person, place, and time. Normal muscle tone coordination. No cranial nerve deficit noted. PSYCHIATRIC: Normal mood and affect. Normal behavior. Normal judgment and thought content. CARDIOVASCULAR: Normal heart rate noted RESPIRATORY: Effort and breath sounds normal, no problems with respiration noted MUSCULOSKELETAL: Normal range of motion. No edema noted.  Labs and Imaging   Assessment & Plan:  1. Visit for oral contraceptive  prescription Changed to Sprintec not that she is not truly breastfeeding for more effective contraception benefit.  2. Carpal tunnel syndrome on both sides Does repetitive motion daily when cooking bread. Advised to get braces to wear at night. Advised to follow up with [facility] if the braces are not effective.   Routine preventative health maintenance measures emphasized. Please refer to After Visit Summary for other counseling recommendations.   Return in about 2 months (around 10/10/2019).   Total face-to-face time with patient: 10 minutes.  Over 50% of encounter was spent on counseling and coordination of care.  Nolene Bernheim, RN, MSN, NP-BC Nurse Practitioner, [facility] for [employer], [facility] 08/10/2019 11:40 AM

## 2019-08-10 NOTE — Progress Notes (Signed)
Pt states at night from her elbo down to hands hurts.

## 2019-08-10 NOTE — Patient Instructions (Signed)
Sndrome del tnel carpiano Carpal Tunnel Syndrome  El sndrome del tnel carpiano es una afeccin que causa dolor en la mano y en el brazo. El tnel carpiano es un rea estrecha que se encuentra en el lado palmar de la mueca. Los movimientos repetidos de la mueca o determinadas enfermedades pueden causar hinchazn en el tnel. Esta hinchazn puede comprimir el nervio principal de la mueca (nervio mediano). Cules son las causas? Esta afeccin puede ser causada por lo siguiente:  Movimientos repetidos de la mueca.  Lesiones en la mueca.  Artritis.  Un saco lleno de lquido (quiste) o un crecimiento anormal (tumor) en el tnel carpiano.  Acumulacin de lquido durante el embarazo. Algunas veces, la causa no se conoce. Qu incrementa el riesgo? Los siguientes factores pueden hacer que sea ms propenso a contraer esta afeccin:  Tener un trabajo que exige mover la mueca del mismo modo muchas veces. Esto incluye trabajos como el de carnicero o el de cajero.  Ser mujer.  Tener otras afecciones, por ejemplo: ? Diabetes. ? Obesidad. ? Una glndula tiroidea que no es lo suficientemente activa (hipotiroidismo). ? Insuficiencia renal. Cules son los signos o sntomas? Los sntomas de esta afeccin incluyen:  Sensacin de hormigueo en los dedos.  Hormigueo o prdida de la sensibilidad (adormecimiento) en la mano.  Dolor en todo el brazo. Este dolor puede empeorar al flexionar la mueca y el codo durante mucho tiempo.  Dolor en la mueca que sube por el brazo hasta el hombro.  Dolor que baja hasta la palma de la mano o los dedos.  Sensacin de debilidad en las manos. Puede resultarle difcil tomar y sostener objetos. Es posible que se sienta peor por la noche. Cmo se diagnostica? Esta afeccin se diagnostica mediante los antecedentes mdicos y un examen fsico. Tambin pueden hacerle estudios, por ejemplo:  Una electromiograma (EMG). Este estudio comprueba las seales  que los nervios envan a los msculos.  Estudio de conduccin nerviosa. Este estudio comprueba si las seales pasan correctamente por los nervios.  Estudios de diagnstico por imgenes, como radiografas, una ecografa y una resonancia magntica (RM). Estos estudios permiten detectar cul podra ser la causa de su afeccin. Cmo se trata? El tratamiento de esta afeccin puede incluir:  Cambios en el estilo de vida. Se le pedir que deje o cambie la actividad que caus el problema.  Hacer ejercicio y actividades para que los msculos y los huesos se vuelvan ms fuertes (fisioterapia).  Aprender a usar la mano nuevamente (terapia ocupacional).  Medicamentos para tratar el dolor y la hinchazn (inflamacin). Es posible que le apliquen inyecciones en la mueca.  Una frula para la mueca.  Una ciruga. Siga estas indicaciones en su casa: Si tiene una frula:  Use la frula como se lo haya indicado el mdico. Quteselo solamente como se lo haya indicado el mdico.  Afloje la frula si los dedos: ? Hormiguean. ? Pierden la sensibilidad (se adormecen). ? Se tornan fros y de color azul.  Mantenga la frula limpia.  Si la frula no es impermeable: ? No deje que se moje. ? Cbralo con un envoltorio hermtico cuando tome un bao de inmersin o una ducha. Control del dolor, la rigidez y la hinchazn   Si se lo indican, aplique hielo sobre la zona dolorida: ? Si tiene una frula desmontable, qutesela como se lo haya indicado el mdico. ? Ponga el hielo en una bolsa plstica. ? Coloque una toalla entre la piel y la bolsa. ? Coloque el hielo   durante 20minutos, 2a3veces al da. Indicaciones generales  Tome los medicamentos de venta libre y los recetados solamente como se lo haya indicado el mdico.  Descanse la mueca de cualquier actividad que le cause dolor. Si es necesario, hable con su jefe en el trabajo sobre los cambios que pueden ayudar a la curacin de la mueca.  Haga  los ejercicios como se lo hayan indicado el mdico, el fisioterapeuta o el terapeuta ocupacional.  Concurra a todas las visitas de seguimiento como se lo haya indicado el mdico. Esto es importante. Comunquese con un mdico si:  Aparecen nuevos sntomas.  Los medicamentos no le alivian el dolor.  Sus sntomas empeoran. Solicite ayuda de inmediato si:  Tiene adormecimiento u hormigueo muy intensos en la mueca o la mano. Resumen  El sndrome del tnel carpiano es una afeccin que causa dolor en la mano y en el brazo.  Suele deberse a movimientos repetidos de la mueca.  Este problema se trata mediante cambios en el estilo de vida y medicamentos. La ciruga puede ser necesaria en casos muy graves.  Siga las indicaciones del mdico sobre el uso de una frula, el reposo de la mueca, la asistencia a las consultas de control y llamar para pedir ayuda. Esta informacin no tiene como fin reemplazar el consejo del mdico. Asegrese de hacerle al mdico cualquier pregunta que tenga. Document Revised: 09/18/2017 Document Reviewed: 09/18/2017 Elsevier Patient Education  2020 Elsevier Inc.  

## 2020-04-29 NOTE — L&D Delivery Note (Addendum)
OB/GYN Faculty Practice Delivery Note  Esme Freund is a 35 y.o. [id number] s/p SVD at [redacted]w[redacted]d. She was admitted for IOL for [name].   ROM: 1h 59m with clear fluid, terminal meconium GBS Status: Negative Maximum Maternal Temperature: 98.3  Labor Progress: Cytotec x2, SROM with exam at 2141  Delivery Date/Time: 12/13/20 at 2312 Delivery: Called to room and patient was complete and pushing. Head delivered OA. No nuchal cord present. Shoulder and body delivered in usual fashion. Infant with spontaneous cry, placed on mother's abdomen, dried and stimulated. Cord clamped x 2 after 1-minute delay, and cut by fob under my direct supervision. Cord blood drawn. Placenta delivered spontaneously with gentle cord traction. Fundus firm with massage and Pitocin. Labia, perineum, vagina, and cervix were inspected, no tears.   Placenta: Intact, 3 vessel cord, sent to L&D Complications: None Lacerations: None EBL: 200 mL Analgesia: None  Postpartum Planning [ ]  Monitor BP's for PP HTN [ ]  message to sent to schedule follow-up  [ ]  vaccines UTD  Infant: Healthy baby girl  APGARs 8,9  weight pending  , MD  ________  Patient is a at [redacted]w[redacted]d who was admitted for IOL due to [name], significant hx of AMA, language barrier and postdates preg.  She progressed with augmentation via IP foley and cytotec.  I was gloved and present for delivery in its entirety.  Second stage of labor progressed, baby delivered after pushing with a few contractions.  No decels during second stage noted.  Complications: none  Lacerations: none  EBL: 200cc  Jovita Kussmaul, CNM 9:12 AM 12/14/2020

## 2020-06-06 ENCOUNTER — Other Ambulatory Visit: Payer: Self-pay

## 2020-06-06 ENCOUNTER — Encounter: Payer: Self-pay | Admitting: Family Medicine

## 2020-06-06 ENCOUNTER — Other Ambulatory Visit (HOSPITAL_COMMUNITY)
Admission: RE | Admit: 2020-06-06 | Discharge: 2020-06-06 | Disposition: A | Discharge status: Home / Self Care | Payer: Self-pay | Source: Ambulatory Visit | Attending: Family Medicine | Admitting: Family Medicine

## 2020-06-06 ENCOUNTER — Ambulatory Visit (INDEPENDENT_AMBULATORY_CARE_PROVIDER_SITE_OTHER): Payer: Self-pay | Admitting: Family Medicine

## 2020-06-06 VITALS — BP 120/79 | HR 79 | Wt 157.3 lb

## 2020-06-06 DIAGNOSIS — Z3480 Encounter for supervision of other normal pregnancy, unspecified trimester: Secondary | ICD-10-CM | POA: Insufficient documentation

## 2020-06-06 DIAGNOSIS — O132 Gestational [pregnancy-induced] hypertension without significant proteinuria, second trimester: Secondary | ICD-10-CM

## 2020-06-06 DIAGNOSIS — F32A Depression, unspecified: Secondary | ICD-10-CM

## 2020-06-06 DIAGNOSIS — Z789 Other specified health status: Secondary | ICD-10-CM

## 2020-06-06 MED ORDER — ASPIRIN EC 81 MG PO TBEC: 81.0000 mg | DELAYED_RELEASE_TABLET | Freq: Every day | ORAL | 11 refills | Status: DC

## 2020-06-06 NOTE — Progress Notes (Signed)
[place] scheduled for 06/19/20 at 11 am. Pt aware. Orders signed and faxed today.

## 2020-06-06 NOTE — Patient Instructions (Signed)
Eleccin del mtodo anticonceptivo Contraception Choices Los mtodos anticonceptivos son las cosas que usted hace o [place] para Therapist, occupational. Tambin se los denomina "anticoncepcin". Hay varios mtodos anticonceptivos. Hable con su mdico sobre el mejor mtodo para usted. Mtodos anticonceptivos hormonales Este tipo de mtodo anticonceptivo contiene hormonas. A continuacin se mencionan algunos tipos de mtodos anticonceptivos hormonales:  Un tubo que se coloca debajo de la piel del brazo (implante). El tubo International aid/development worker colocado durante 3 aos como mximo.  Inyecciones que se deben aplicar cada 3 meses.  Pldoras que se deben tomar [employer].  Un parche que se debe cambiar 1 vez por semana durante 3 semanas. Despus de [employer], el parche se debe retirar durante 1semana.  Un anillo que se coloca en la vagina. El anillo se deja colocado durante 3 semanas. Luego, se debe retirar de Theatre stage manager. Despus se coloca un nuevo anillo en la vagina.  Pldoras que se deben tomar despus de tener relaciones sexuales sin proteccin. Estas se denominan pldoras anticonceptivas de emergencia.   Mtodos anticonceptivos de barrera A continuacin se mencionan algunos tipos de mtodos anticonceptivos de barrera:  Una cubierta delgada que se coloca sobre el pene antes de tener sexo (condn masculino). La cubierta se desecha despus de Merrill Lynch.  Una cubierta blanda y suelta que se coloca en la vagina antes de tener sexo (condn femenino). La cubierta se desecha despus de Merrill Lynch.  Un dispositivo de goma que se aplica sobre el cuello uterino (diafragma). Este dispositivo debe fabricarse para usted. Se coloca en la vagina antes de tener sexo. Se debe dejar colocado durante 6 a 8horas despus de Merrill Lynch. Se debe retirar en un plazo de 24horas.  Un capuchn pequeo y Goldman Sachs se fija sobre el cuello uterino (capuchn cervical). Este capuchn debe fabricarse para  usted. El capuchn se debe dejar colocado durante 6 a 8horas despus de Clinical biochemist. Se debe retirar en un plazo de 48horas.  Una esponja que se coloca en la vagina antes de tener sexo. Se debe dejar colocada durante al menos 6horas despus de Merrill Lynch. Se debe retirar en un plazo de 30horas y desecharse.  Una sustancia qumica que destruye o impide que los espermatozoides ingresen al tero. Esa sustancia qumica se llama espermicida. Se puede presentar en forma de pldora, crema, gel o espuma y se debe colocar en la vagina. La sustancia qumica se debe usar al [employer] de 10 a 71minutos antes de Merrill Lynch.   Mtodos anticonceptivos con un DIU DIU significa "dispositivo intrauterino". Se coloca en el interior del tero. Existen dos tipos diferentes:  DIU hormonal. Este tipo puede permanecer colocado en el tero durante 3 a 5 aos.  DIU de cobre. Este tipo International aid/development worker colocado en el tero durante 10 aos. Mtodo anticonceptivo permanente A continuacin se mencionan algunos tipos de mtodos anticonceptivos permanentes:  Ciruga para obstruir las trompas de [place].  Colocacin de un dispositivo en cada una de las trompas de [place]. Este mtodo funciona al cabo de 3 meses. Se deben usar otros mtodos anticonceptivos durante 3 meses.  Ciruga para atar los conductos que transportan los espermatozoides (vasectoma). Este mtodo funciona al cabo de 3 meses. Se deben usar otros mtodos anticonceptivos durante 3 meses. Mtodos anticonceptivos por planificacin natural A continuacin se mencionan algunos mtodos anticonceptivos por planificacin natural:  No tener Dillard's frtiles de la [place].  Usar un calendario a fin de: ? Hacer un seguimiento de  la duracin de cada ciclo menstrual. ? Determinar en [employer] se podra producir Firefighter. ? Planificar no tener [employer] States [employer] Corporation en que se podra producir Firefighter.  Reconocer los signos de la ovulacin y no  tener relaciones sexuales durante ese perodo. Craig Staggers en que la mujer puede detectar la ovulacin es controlarse la temperatura.  Esperar para tener sexo hasta despus de la ovulacin. Dnde buscar ms informacin  Centers for Disease Control and Prevention (Centros para el Control y Psychiatrist de Event organiser): FootballExhibition.com.br Resumen  La anticoncepcin, o los mtodos anticonceptivos, hace referencia a las cosas que usted hace o [place] para Location manager.  Los mtodos anticonceptivos hormonales incluyen implantes, inyecciones, pldoras, parches, anillos vaginales y pldoras anticonceptivas de Associate Professor.  Los mtodos anticonceptivos de barrera pueden incluir preservativos masculinos, preservativos femeninos, diafragmas, capuchones cervicales, esponjas y espermicidas.  [employer] tipos diferentes de DIU (dispositivo intrauterino) anticonceptivo. Un DIU puede colocarse en el tero de una mujer para evitar el embarazo durante varios aos.  Un mtodo anticonceptivo [employer] Partners un procedimiento para hombres, mujeres o ambos. La planificacin familiar natural significa no tener sexo cuando la mujer podra quedar embarazada. Esta informacin no tiene Theme park manager el consejo del mdico. Asegrese de hacerle al mdico cualquier pregunta que tenga. Document Revised: 11/16/2019 Document Reviewed: 11/16/2019 Elsevier Patient Education  2021 Elsevier Inc.   [place] trimestre de Psychiatrist Second Trimester of Pregnancy  El segundo trimestre de embarazo va desde la semana 13 hasta la semana 27. Tambin se dice que va desde el mes 4 hasta el mes 6 de [place]. Este suele ser el momento en el que mejor se siente. Durante el segundo trimestre:  Las nuseas del embarazo han disminuido o han desaparecido.  Usted puede tener ms energa.  Usted puede tener hambre con ms frecuencia. En esta poca, el beb en gestacin (feto) crece muy rpido. Hacia el final del sexto  mes, el beb en gestacin puede medir aproximadamente 12 pulgadas y pesar alrededor de 1 libras. Es probable que comience a Engineer, structural beb se Exelon Corporation las 16 y las 20 [address] de Psychiatrist. Cambios en el cuerpo durante el segundo trimestre Su organismo contina atravesando por muchos cambios durante este perodo. Los cambios varan y generalmente vuelven a la normalidad despus del nacimiento del beb. Cambios fsicos  Aumentar ms peso.  Podrn aparecer las primeras estras en las caderas, el vientre (abdomen) y las [place].  Las [employer]Agra Foods crecern y Writer.  Pueden aparecer zonas oscuras o manchas en el rostro.  Es posible que se forme una lnea oscura desde el ombligo hasta la zona del pubis (linea nigra).  Tal vez haya cambios en el cabello. Cambios en la salud  Es posible que tenga dolores de [place].  Es posible que tenga acidez estomacal.  Es posible que tenga dificultades para defecar (estreimiento).  Es posible que tenga hemorroides o venas abultadas e hinchadas (venas varicosas).  Las encas pueden sangrarle.  Es posible que haga pis (orine) con mayor frecuencia.  Puede sentir dolor en la espalda. Siga estas instrucciones en su casa: Medicamentos  Use los medicamentos de venta libre y los recetados solamente como se lo haya indicado el mdico. Algunos medicamentos no son seguros Academic librarian.  Tome vitaminas prenatales que contengan por lo menos (mcg) de cido flico. Comida y bebida  Consuma comidas saludables que incluyan lo siguiente: ? Nils Pyle y verduras frescas. ? Cereales integrales. ? Buenas fuentes de  protenas, como carne, huevos y tofu. ? Productos lcteos con bajo contenido de grasa.  Evite la carne cruda y el [place], la [place] y el queso sin Market researcher.  Es posible que deba tomar medidas para prevenir o tratar los problemas para defecar: ? Product manager suficiente lquido para Radio producer pis (orina) de color amarillo  plido. ? Come alimentos ricos en fibra. Entre ellos, frijoles, cereales integrales y frutas y verduras frescas. ? Limitar los alimentos con alto contenido de grasa y International aid/development worker. Estos incluyen alimentos fritos o dulces. Actividad  Haga ejercicios solamente como se lo haya indicado el mdico. La mayora de las personas pueden realizar su actividad fsica habitual durante el [place]. Intente realizar como mnimo de actividad fsica por lo menos 5das a la semana.  Deje de hacer ejercicio si tiene dolor o clicos en el vientre o en la zona lumbar.  No haga ejercicio si hace demasiado calor, hay demasiada humedad o se encuentra en un lugar de mucha altura (altitud elevada).  Evite levantar pesos [employer]ands.  Si lo desea, puede continuar teniendo [employer] [place] [place] [employer], a menos que el mdico le indique lo contrario. Alivio del dolor y del Dentist  Use un sostn que le brinde buen soporte si le duelen las [place].  Dese baos de asiento con agua tibia para Engineer, materials o las molestias causadas por las hemorroides. Use una crema para las hemorroides si el mdico la autoriza.  Descanse con las piernas levantadas (elevadas) si tiene calambres en las piernas o dolor en la parte baja de la espalda.  Si desarrolla venas abultadas en las piernas: ? Use medias de compresin segn las indicaciones de su mdico. ? Levante los pies durante , 3 o 4veces por Futures trader. ? Limite la sal en sus alimentos. Seguridad  Use el cinturn de seguridad en todo momento mientras vaya en auto.  Hable con el mdico si alguien le est haciendo dao o gritando [place]. Estilo de vida  No se d baos de inmersin en agua caliente, baos turcos ni saunas.  No se haga duchas vaginales. No use tampones ni toallas higinicas perfumadas.  Evite el contacto con las bandejas sanitarias de los gatos y la tierra que estos animales usan. Estos contienen grmenes que pueden daar al beb y causar la prdida del  beb ya sea aborto espontneo o muerte fetal.  No consuma medicamentos a base de hierbas, drogas ilegales, ni medicamentos que el mdico no haya autorizado. No beba alcohol.  No fume ni consuma ningn producto que contenga nicotina o tabaco. Si necesita ayuda para dejar de fumar, consulte al mdico. Instrucciones generales  Cumpla con todas las visitas de seguimiento. Esto es importante.  Consulte a su mdico acerca de dnde se dictan clases prenatales cerca de donde vive.  Consulte a su mdico sobre los ConocoPhillips debe comer o pdale que la ayude a Clinical research associate a Facilities manager. Dnde buscar ms informacin  American Pregnancy Association (Asociacin Americana del Embarazo): americanpregnancy.org  [employer] of Obstetricians and Gynecologists (Colegio Estadounidense de Obstetras y Gineclogos): www.acog.org  Office on Pitney Bowes (Oficina para la Salud de la Mujer): MightyReward.co.nz Comunquese con un mdico si:  Tiene un dolor de cabeza que no desaparece despus de Science writer.  Nota cambios en la visin o ve manchas delante de los ojos.  Tiene clicos o siente presin o dolor leves en la parte baja del vientre.  Sigue sintiendo como si fuera a vomitar (nuseas), vomita o hace deposiciones acuosas (diarrea).  Advierte lquido con  mal olor que proviene de la vagina.  Siente dolor al orinar o hace orina con mal olor.  Tiene una gran hinchazn en la cara, las manos, las piernas, los tobillos o los pies.  Tiene fiebre. Solicite ayuda de inmediato si:  Tiene una prdida de lquido por la vagina.  Tiene sangrado o pequeas prdidas vaginales.  Tiene clicos o dolor muy intensos en el vientre.  Tiene dificultad para respirar.  Sientes dolor en el pecho.  Se desmaya.  No ha sentido que el beb se moviera durante el perodo de tiempo que le dijo el mdico.  Licensed conveyancer, hinchazn o enrojecimiento nuevos en un brazo o una pierna o se produce un aumento  de alguno de estos sntomas. Resumen  El segundo trimestre de embarazo va desde la semana 13 hasta la 27 (desde el mes 4 hasta el 6).  Consuma comidas saludables.  Haga ejercicios tal como le indic el mdico. La mayora de las personas pueden realizar su actividad fsica habitual durante el [place].  No consuma medicamentos a base de hierbas, drogas ilegales, ni medicamentos que el mdico no haya autorizado. No beba alcohol.  Llame al mdico si se enferma o si nota algo inusual acerca de su embarazo. Esta informacin no tiene Theme park manager el consejo del mdico. Asegrese de hacerle al mdico cualquier pregunta que tenga. Document Revised: 10/29/2019 Document Reviewed: 10/29/2019 Elsevier Patient Education  2021 [employer].

## 2020-06-06 NOTE — Progress Notes (Signed)
Subjective:   Connie Boyd is a 35 y.o. [id number] at [redacted]w[redacted]d by LMP being seen today for her first obstetrical visit. Reports she was using OCP's and her period was somewhat irregular. Her obstetrical history is significant for gestational hypertension in the last pregnancy. Patient does intend to breast feed. Pregnancy history fully reviewed.  Patient reports no complaints.  HISTORY: OB History  Gravida Para Term Preterm AB Living  6 4 4  0 1 4  SAB IAB Ectopic Multiple Live Births  1 0 0 0 4    # Outcome Date GA Lbr Len/2nd Weight Sex Delivery Anes PTL Lv  6 Current           5 Term 11/09/18 [redacted]w[redacted]d 05:21 / 00:17 7 lb 14.6 oz (3.59 kg) F Vag-Spont None  LIV     Name: NORABELLE, KONDO     Apgar1: 9  Apgar5: 9  4 Term 02/03/12 [redacted]w[redacted]d 03:05 / 00:13 6 lb 1 oz (2.75 kg) M Vag-Spont None  LIV     Name: RAMIREZ-JASSO,BOY Desera     Apgar1: 8  Apgar5: 9  3 Term 02/22/08 [redacted]w[redacted]d   F Vag-Spont None N LIV  2 Term 03/03/05 105w0d   M Vag-Spont EPI N LIV  1 SAB            Last pap smear was  06/01/2018 and was normal Past Medical History:  Diagnosis Date  . Pregnancy induced hypertension    Past Surgical History:  Procedure Laterality Date  . APPENDECTOMY    . OVARIAN CYST REMOVAL     Family History  Problem Relation Age of Onset  . Cancer Paternal Uncle   . Cancer Mother   . Diabetes Paternal Grandfather   . Anesthesia problems Neg Hx   . Other Neg Hx    Social History   Tobacco Use  . Smoking status: Never Smoker  . Smokeless tobacco: Never Used  Vaping Use  . Vaping Use: Never used  Substance Use Topics  . Alcohol use: Yes    Alcohol/week: 1.0 standard drink    Types: 1 Cans of beer per week  . Drug use: Never   No Known Allergies Current Outpatient Medications on File Prior to Visit  Medication Sig Dispense Refill  . acetaminophen (TYLENOL) 325 MG tablet Take 325 mg by mouth daily as needed for mild pain.    07/31/2018 FOLIC ACID PO Take 1 tablet by mouth  daily.    . Magnesium 200 MG TABS Take 1 tablet (200 mg total) by mouth at bedtime. 30 each 3  . Prenatal Vit-Fe Fumarate-FA (MULTIVITAMIN-PRENATAL) 27-0.8 MG TABS tablet Take 1 tablet by mouth daily at 12 noon.     No current facility-administered medications on file prior to visit.     Exam   Vitals:   06/06/20 1146  BP: 120/79  Pulse: 79  Weight: 157 lb 4.8 oz (71.4 kg)   Fetal Heart Rate (bpm): 135  Uterus:     Pelvic Exam: Deferred                       System: General: well-developed, well-nourished female in no acute distress   Skin: normal coloration and turgor, no rashes   Neurologic: oriented, normal, negative, normal mood   Extremities: normal strength, tone, and muscle mass, ROM of all joints is normal   HEENT PERRLA, extraocular movement intact and sclera clear, anicteric   Mouth/Teeth mucous membranes moist, pharynx normal without  lesions and dental hygiene good   Neck supple and no masses   Respiratory:  no respiratory distress, normal breath sounds     Assessment:   Pregnancy: [id number] Patient Active Problem List   Diagnosis Date Noted  . SVD (spontaneous vaginal delivery) 11/09/2018  . Non-English speaking patient 10/29/2018  . Gestational hypertension 10/29/2018  . Limited prenatal care in third trimester 10/15/2018  . Supervision of other normal pregnancy, antepartum 06/01/2018  . Depression 11/20/2011     Plan:  1. Supervision of other normal pregnancy, antepartum Uncertain dates w irregular period and on OCP's at time of conception but [facility], scheduled for [place] and let patient know we may adjust her dates significantly pending that [place] on 06/19/2020 Patient very interested in getting partner a vasectomy as she has trialed multiple LARCs including IUD and Depo as well as OCP's without success Personally contacted the [place] [facility] program by phone and provided her contact information, follow up at next visit Initial labs  drawn. Continue prenatal vitamins. Genetic Screening discussed, NIPS: declined. Ultrasound discussed; fetal anatomic survey: scheduled. Problem list reviewed and updated. The nature of [facility] with multiple MDs and other Advanced Practice Providers was explained to patient; also emphasized that residents, students are part of our team. Routine obstetric precautions reviewed.  2. Gestational hypertension, second trimester Start prenatal ASA though a little late to do so Explained rationale and evidence to patient  3. Depression, unspecified depression type Mood stable  4. Non-English speaking patient Spanish    Return in about 4 weeks (around 07/04/2020) for ob visit.

## 2020-06-06 NOTE — Addendum Note (Signed)
Addended by: Isabell Jarvis on: 06/06/2020 01:57 PM   Modules accepted: Orders

## 2020-06-07 LAB — CBC/D/PLT+RPR+RH+ABO+RUB AB...
Antibody Screen: NEGATIVE
Basophils Absolute: 0 10*3/uL (ref 0.0–0.2)
Basos: 0 %
EOS (ABSOLUTE): 0 10*3/uL (ref 0.0–0.4)
Eos: 0 %
HCV Ab: <0.1 s/co ratio (ref 0.0–0.9)
HIV Screen 4th Generation wRfx: NONREACTIVE
Hematocrit: 37.9 % (ref 34.0–46.6)
Hemoglobin: 13.4 g/dL (ref 11.1–15.9)
Hepatitis B Surface Ag: NEGATIVE
Immature Grans (Abs): 0 10*3/uL (ref 0.0–0.1)
Immature Granulocytes: 1 %
Lymphocytes Absolute: 0.3 10*3/uL — ABNORMAL LOW (ref 0.7–3.1)
Lymphs: 5 %
MCH: 31.7 pg (ref 26.6–33.0)
MCHC: 35.4 g/dL (ref 31.5–35.7)
MCV: 90 fL (ref 79–97)
Monocytes Absolute: 0.4 10*3/uL (ref 0.1–0.9)
Monocytes: 6 %
Neutrophils Absolute: 4.8 10*3/uL (ref 1.4–7.0)
Neutrophils: 88 %
Platelets: 164 10*3/uL (ref 150–450)
RBC: 4.23 x10E6/uL (ref 3.77–5.28)
RDW: 12.9 % (ref 11.7–15.4)
RPR Ser Ql: NONREACTIVE
Rh Factor: POSITIVE
Rubella Antibodies, IGG: 6.62 index (ref >0.99)
WBC: 5.5 10*3/uL (ref 3.4–10.8)

## 2020-06-07 LAB — GC/CHLAMYDIA PROBE AMP (~~LOC~~) NOT AT ARMC
Chlamydia: NEGATIVE
Comment: NEGATIVE
Comment: NORMAL
Neisseria Gonorrhea: NEGATIVE

## 2020-06-07 LAB — HEMOGLOBIN A1C
Est. average glucose Bld gHb Est-mCnc: 97 mg/dL
Hgb A1c MFr Bld: 5 % (ref 4.8–5.6)

## 2020-06-07 LAB — HCV INTERPRETATION

## 2020-06-08 LAB — URINE CULTURE, OB REFLEX

## 2020-06-08 LAB — CULTURE, OB URINE

## 2020-07-04 ENCOUNTER — Encounter: Payer: Self-pay | Admitting: General Practice

## 2020-07-05 ENCOUNTER — Encounter: Payer: Self-pay | Admitting: *Deleted

## 2020-07-05 ENCOUNTER — Ambulatory Visit (INDEPENDENT_AMBULATORY_CARE_PROVIDER_SITE_OTHER): Payer: Self-pay | Admitting: Obstetrics & Gynecology

## 2020-07-05 ENCOUNTER — Telehealth: Payer: Self-pay

## 2020-07-05 ENCOUNTER — Other Ambulatory Visit: Payer: Self-pay

## 2020-07-05 VITALS — BP 114/75 | HR 80 | Wt 162.1 lb

## 2020-07-05 DIAGNOSIS — Z3480 Encounter for supervision of other normal pregnancy, unspecified trimester: Secondary | ICD-10-CM

## 2020-07-05 DIAGNOSIS — Z789 Other specified health status: Secondary | ICD-10-CM

## 2020-07-05 NOTE — Telephone Encounter (Signed)
Called Pt using [place] [employer] [place] id# [phone] to advise Pt of [place] U/S ON 07/17/20 @ 11:15AM, no answer, left Pt VM.

## 2020-07-05 NOTE — Progress Notes (Signed)
[place] detail U/S scheduled for 07/17/20 @ 11:15a

## 2020-07-05 NOTE — Patient Instructions (Signed)

## 2020-07-31 ENCOUNTER — Encounter: Payer: Self-pay | Admitting: *Deleted

## 2020-08-02 ENCOUNTER — Ambulatory Visit (INDEPENDENT_AMBULATORY_CARE_PROVIDER_SITE_OTHER): Payer: Self-pay | Admitting: Obstetrics and Gynecology

## 2020-08-02 ENCOUNTER — Other Ambulatory Visit: Payer: Self-pay

## 2020-08-02 VITALS — BP 122/73 | HR 79 | Wt 160.7 lb

## 2020-08-02 DIAGNOSIS — Z3480 Encounter for supervision of other normal pregnancy, unspecified trimester: Secondary | ICD-10-CM

## 2020-08-02 DIAGNOSIS — Z3A21 21 weeks gestation of pregnancy: Secondary | ICD-10-CM | POA: Insufficient documentation

## 2020-08-02 DIAGNOSIS — O09522 Supervision of elderly multigravida, second trimester: Secondary | ICD-10-CM | POA: Insufficient documentation

## 2020-08-02 DIAGNOSIS — Z789 Other specified health status: Secondary | ICD-10-CM

## 2020-08-02 NOTE — Progress Notes (Signed)
   PRENATAL VISIT NOTE  Subjective:  Connie Boyd is a 35 y.o. [phone] at [redacted]w[redacted]d being seen today for ongoing prenatal care.  She is currently monitored for the following issues for this high-risk pregnancy and has Supervision of other normal pregnancy, antepartum; Limited prenatal care in third trimester; Non-English speaking patient; [redacted] weeks gestation of pregnancy; Advanced maternal age in multigravida, second trimester; and Language barrier on their problem list.  Patient doing well with no acute concerns today. She reports no complaints.  Contractions: Not present. Vag. Bleeding: None.  Movement: Present. Denies leaking of fluid.   The following portions of the patient's history were reviewed and updated as appropriate: allergies, current medications, past family history, past medical history, past social history, past surgical history and problem list. Problem list updated.  Objective:   Vitals:   08/02/20 1015  BP: 122/73  Pulse: 79  Weight: 160 lb 11.2 oz (72.9 kg)    Fetal Status: Fetal Heart Rate (bpm): 151 Fundal Height: 20 cm Movement: Present     General:  Alert, oriented and cooperative. Patient is in no acute distress.  Skin: Skin is warm and dry. No rash noted.   Cardiovascular: Normal heart rate noted  Respiratory: Normal respiratory effort, no problems with respiration noted  Abdomen: Soft, gravid, appropriate for gestational age.  Pain/Pressure: Absent     Pelvic: Cervical exam deferred        Extremities: Normal range of motion.  Edema: None  Mental Status:  Normal mood and affect. Normal behavior. Normal judgment and thought content.   Assessment and Plan:  Pregnancy: [id number] at [redacted]w[redacted]d  1. Supervision of other normal pregnancy, antepartum Continue routine care  2. [redacted] weeks gestation of pregnancy   3. Advanced maternal age in multigravida, second trimester   4. Language barrier Interpreter present  Preterm labor symptoms and general obstetric  precautions including but not limited to vaginal bleeding, contractions, leaking of fluid and fetal movement were reviewed in detail with the patient.  Please refer to After Visit Summary for other counseling recommendations.   Return in about 4 weeks (around 08/30/2020) for [facility], in person.   Mariel Aloe, MD Faculty Attending Center for [facility]

## 2020-08-02 NOTE — Patient Instructions (Signed)
Segundo trimestre de embarazo Second Trimester of Pregnancy  El segundo trimestre de embarazo va desde la semana 13 hasta la semana 27. Tambin se dice que va desde el mes 4 hasta el mes 6 de embarazo. Este suele ser el momento en el que mejor se siente. Durante el segundo trimestre:  Las nuseas del embarazo han disminuido o han desaparecido.  Usted puede tener ms energa.  Usted puede tener hambre con ms frecuencia. En esta poca, el beb en gestacin (feto) crece muy rpido. Hacia el final del sexto mes, el beb en gestacin puede medir aproximadamente 12 pulgadas y pesar alrededor de 1 libras. Es probable que comience a sentir que el beb se mueve entre las 16 y las 20 semanas de embarazo. Cambios en el cuerpo durante el segundo trimestre Su organismo contina atravesando por muchos cambios durante este perodo. Los cambios varan y generalmente vuelven a la normalidad despus del nacimiento del beb. Cambios fsicos  Aumentar ms peso.  Podrn aparecer las primeras estras en las caderas, el vientre (abdomen) y las mamas.  Las mamas crecern y pueden doler.  Pueden aparecer zonas oscuras o manchas en el rostro.  Es posible que se forme una lnea oscura desde el ombligo hasta la zona del pubis (linea nigra).  Tal vez haya cambios en el cabello. Cambios en la salud  Es posible que tenga dolores de cabeza.  Es posible que tenga acidez estomacal.  Es posible que tenga dificultades para defecar (estreimiento).  Es posible que tenga hemorroides o venas abultadas e hinchadas (venas varicosas).  Las encas pueden sangrarle.  Es posible que haga pis (orine) con mayor frecuencia.  Puede sentir dolor en la espalda. Siga estas instrucciones en su casa: Medicamentos  Use los medicamentos de venta libre y los recetados solamente como se lo haya indicado el mdico. Algunos medicamentos no son seguros durante el embarazo.  Tome vitaminas prenatales que contengan por lo menos  600microgramos (mcg) de cido flico. Comida y bebida  Consuma comidas saludables que incluyan lo siguiente: ? Frutas y verduras frescas. ? Cereales integrales. ? Buenas fuentes de protenas, como carne, huevos y tofu. ? Productos lcteos con bajo contenido de grasa.  Evite la carne cruda y el jugo, la leche y el queso sin pasteurizar.  Es posible que deba tomar medidas para prevenir o tratar los problemas para defecar: ? Beber suficiente lquido para mantener el pis (orina) de color amarillo plido. ? Come alimentos ricos en fibra. Entre ellos, frijoles, cereales integrales y frutas y verduras frescas. ? Limitar los alimentos con alto contenido de grasa y azcar. Estos incluyen alimentos fritos o dulces. Actividad  Haga ejercicios solamente como se lo haya indicado el mdico. La mayora de las personas pueden realizar su actividad fsica habitual durante el embarazo. Intente realizar como mnimo 30minutos de actividad fsica por lo menos 5das a la semana.  Deje de hacer ejercicio si tiene dolor o clicos en el vientre o en la zona lumbar.  No haga ejercicio si hace demasiado calor, hay demasiada humedad o se encuentra en un lugar de mucha altura (altitud elevada).  Evite levantar pesos excesivos.  Si lo desea, puede continuar teniendo relaciones sexuales, a menos que el mdico le indique lo contrario. Alivio del dolor y del malestar  Use un sostn que le brinde buen soporte si le duelen las mamas.  Dese baos de asiento con agua tibia para aliviar el dolor o las molestias causadas por las hemorroides. Use una crema para las hemorroides si   el mdico la autoriza.  Descanse con las piernas levantadas (elevadas) si tiene calambres en las piernas o dolor en la parte baja de la espalda.  Si desarrolla venas abultadas en las piernas: ? Use medias de compresin segn las indicaciones de su mdico. ? Levante los pies durante 15minutos, 3 o 4veces por da. ? Limite la sal en sus  alimentos. Seguridad  Use el cinturn de seguridad en todo momento mientras vaya en auto.  Hable con el mdico si alguien le est haciendo dao o gritando mucho. Estilo de vida  No se d baos de inmersin en agua caliente, baos turcos ni saunas.  No se haga duchas vaginales. No use tampones ni toallas higinicas perfumadas.  Evite el contacto con las bandejas sanitarias de los gatos y la tierra que estos animales usan. Estos contienen grmenes que pueden daar al beb y causar la prdida del beb ya sea aborto espontneo o muerte fetal.  No consuma medicamentos a base de hierbas, drogas ilegales, ni medicamentos que el mdico no haya autorizado. No beba alcohol.  No fume ni consuma ningn producto que contenga nicotina o tabaco. Si necesita ayuda para dejar de fumar, consulte al mdico. Instrucciones generales  Cumpla con todas las visitas de seguimiento. Esto es importante.  Consulte a su mdico acerca de dnde se dictan clases prenatales cerca de donde vive.  Consulte a su mdico sobre los alimentos que debe comer o pdale que la ayude a encontrar a un asesor. Dnde buscar ms informacin  American Pregnancy Association (Asociacin Americana del Embarazo): americanpregnancy.org  American College of Obstetricians and Gynecologists (Colegio Estadounidense de Obstetras y Gineclogos): www.acog.org  Office on Women's Health (Oficina para la Salud de la Mujer): womenshealth.gov/pregnancy Comunquese con un mdico si:  Tiene un dolor de cabeza que no desaparece despus de tomar analgsicos.  Nota cambios en la visin o ve manchas delante de los ojos.  Tiene clicos o siente presin o dolor leves en la parte baja del vientre.  Sigue sintiendo como si fuera a vomitar (nuseas), vomita o hace deposiciones acuosas (diarrea).  Advierte lquido con mal olor que proviene de la vagina.  Siente dolor al orinar o hace orina con mal olor.  Tiene una gran hinchazn en la cara, las  manos, las piernas, los tobillos o los pies.  Tiene fiebre. Solicite ayuda de inmediato si:  Tiene una prdida de lquido por la vagina.  Tiene sangrado o pequeas prdidas vaginales.  Tiene clicos o dolor muy intensos en el vientre.  Tiene dificultad para respirar.  Sientes dolor en el pecho.  Se desmaya.  No ha sentido que el beb se moviera durante el perodo de tiempo que le dijo el mdico.  Tiene dolor, hinchazn o enrojecimiento nuevos en un brazo o una pierna o se produce un aumento de alguno de estos sntomas. Resumen  El segundo trimestre de embarazo va desde la semana 13 hasta la 27 (desde el mes 4 hasta el 6).  Consuma comidas saludables.  Haga ejercicios tal como le indic el mdico. La mayora de las personas pueden realizar su actividad fsica habitual durante el embarazo.  No consuma medicamentos a base de hierbas, drogas ilegales, ni medicamentos que el mdico no haya autorizado. No beba alcohol.  Llame al mdico si se enferma o si nota algo inusual acerca de su embarazo. Esta informacin no tiene como fin reemplazar el consejo del mdico. Asegrese de hacerle al mdico cualquier pregunta que tenga. Document Revised: 10/29/2019 Document Reviewed: 10/29/2019 Elsevier Patient Education    2021 Elsevier Inc.  

## 2020-08-30 ENCOUNTER — Encounter: Payer: Self-pay | Admitting: Family Medicine

## 2020-08-31 ENCOUNTER — Ambulatory Visit (INDEPENDENT_AMBULATORY_CARE_PROVIDER_SITE_OTHER): Payer: Self-pay | Admitting: Obstetrics and Gynecology

## 2020-08-31 VITALS — BP 110/77 | HR 79 | Wt 162.1 lb

## 2020-08-31 DIAGNOSIS — Z3A25 25 weeks gestation of pregnancy: Secondary | ICD-10-CM | POA: Insufficient documentation

## 2020-08-31 DIAGNOSIS — O09522 Supervision of elderly multigravida, second trimester: Secondary | ICD-10-CM

## 2020-08-31 DIAGNOSIS — Z789 Other specified health status: Secondary | ICD-10-CM

## 2020-08-31 DIAGNOSIS — Z3480 Encounter for supervision of other normal pregnancy, unspecified trimester: Secondary | ICD-10-CM

## 2020-08-31 NOTE — Progress Notes (Signed)
   PRENATAL VISIT NOTE  Subjective:  Connie Boyd is a 35 y.o. [phone] at [redacted]w[redacted]d being seen today for ongoing prenatal care.  She is currently monitored for the following issues for this low-risk pregnancy and has Supervision of other normal pregnancy, antepartum; Limited prenatal care in third trimester; Non-English speaking patient; [redacted] weeks gestation of pregnancy; Advanced maternal age in multigravida, second trimester; Language barrier; and [redacted] weeks gestation of pregnancy on their problem list.  Patient doing well with no acute concerns today. She reports no complaints.  Contractions: Not present. Vag. Bleeding: None.  Movement: Present. Denies leaking of fluid.   The following portions of the patient's history were reviewed and updated as appropriate: allergies, current medications, past family history, past medical history, past social history, past surgical history and problem list. Problem list updated.  Objective:   Vitals:   08/31/20 1109  BP: 110/77  Pulse: 79  Weight: 162 lb 1.6 oz (73.5 kg)    Fetal Status: Fetal Heart Rate (bpm): 147   Movement: Present     General:  Alert, oriented and cooperative. Patient is in no acute distress.  Skin: Skin is warm and dry. No rash noted.   Cardiovascular: Normal heart rate noted  Respiratory: Normal respiratory effort, no problems with respiration noted  Abdomen: Soft, gravid, appropriate for gestational age.  Pain/Pressure: Present     Pelvic: Cervical exam deferred        Extremities: Normal range of motion.  Edema: None  Mental Status:  Normal mood and affect. Normal behavior. Normal judgment and thought content.   Assessment and Plan:  Pregnancy: [id number] at [redacted]w[redacted]d  1. [redacted] weeks gestation of pregnancy   2. Language barrier Discussed in spanish  3. Advanced maternal age in multigravida, second trimester   4. Non-English speaking patient   5. Supervision of other normal pregnancy, antepartum Continue routine  care  Preterm labor symptoms and general obstetric precautions including but not limited to vaginal bleeding, contractions, leaking of fluid and fetal movement were reviewed in detail with the patient.  Please refer to After Visit Summary for other counseling recommendations.   Return in about 2 weeks (around 09/14/2020) for ROB, in person, 2 hr GTT, 3rd trim labs.   Mariel Aloe, MD Faculty Attending Center for [facility]

## 2020-09-18 ENCOUNTER — Other Ambulatory Visit: Payer: Self-pay | Admitting: General Practice

## 2020-09-18 DIAGNOSIS — Z3480 Encounter for supervision of other normal pregnancy, unspecified trimester: Secondary | ICD-10-CM

## 2020-09-20 ENCOUNTER — Other Ambulatory Visit: Payer: Self-pay

## 2020-09-20 ENCOUNTER — Encounter: Payer: Self-pay | Admitting: Obstetrics and Gynecology

## 2020-09-20 ENCOUNTER — Ambulatory Visit (INDEPENDENT_AMBULATORY_CARE_PROVIDER_SITE_OTHER): Payer: Self-pay | Admitting: Obstetrics and Gynecology

## 2020-09-20 VITALS — BP 107/65 | HR 82 | Wt 164.0 lb

## 2020-09-20 DIAGNOSIS — Z3480 Encounter for supervision of other normal pregnancy, unspecified trimester: Secondary | ICD-10-CM

## 2020-09-20 DIAGNOSIS — Z8759 Personal history of other complications of pregnancy, childbirth and the puerperium: Secondary | ICD-10-CM

## 2020-09-20 DIAGNOSIS — Z3A28 28 weeks gestation of pregnancy: Secondary | ICD-10-CM

## 2020-09-20 DIAGNOSIS — Z23 Encounter for immunization: Secondary | ICD-10-CM

## 2020-09-20 DIAGNOSIS — O0990 Supervision of high risk pregnancy, unspecified, unspecified trimester: Secondary | ICD-10-CM

## 2020-09-20 DIAGNOSIS — O09522 Supervision of elderly multigravida, second trimester: Secondary | ICD-10-CM

## 2020-09-20 DIAGNOSIS — Z789 Other specified health status: Secondary | ICD-10-CM

## 2020-09-20 NOTE — Progress Notes (Signed)
   PRENATAL VISIT NOTE  Subjective:  Connie Boyd is a 35 y.o. [phone] at [redacted]w[redacted]d being seen today for ongoing prenatal care.  She is currently monitored for the following issues for this low-risk pregnancy and has Supervision of high risk pregnancy, antepartum; Limited prenatal care in third trimester; Advanced maternal age in multigravida, second trimester; Language barrier; and History of gestational hypertension on their problem list.  Patient reports no complaints.  Contractions: Not present. Vag. Bleeding: None.  Movement: Present. Denies leaking of fluid.   The following portions of the patient's history were reviewed and updated as appropriate: allergies, current medications, past family history, past medical history, past social history, past surgical history and problem list.   Objective:   Vitals:   09/20/20 0913  BP: 107/65  Pulse: 82  Weight: 164 lb (74.4 kg)    Fetal Status: Fetal Heart Rate (bpm): 147 Fundal Height: 28 cm Movement: Present     General:  Alert, oriented and cooperative. Patient is in no acute distress.  Skin: Skin is warm and dry. No rash noted.   Cardiovascular: Normal heart rate noted  Respiratory: Normal respiratory effort, no problems with respiration noted  Abdomen: Soft, gravid, appropriate for gestational age.  Pain/Pressure: Present     Pelvic: Cervical exam deferred        Extremities: Normal range of motion.  Edema: None  Mental Status: Normal mood and affect. Normal behavior. Normal judgment and thought content.   Assessment and Plan:  Pregnancy: [id number] at [redacted]w[redacted]d 1. Supervision of high risk pregnancy, antepartum 28wk labs and tdap today - Tdap vaccine greater than or equal to 7yo IM  2. [redacted] weeks gestation of pregnancy  3. Advanced maternal age in multigravida, second trimester Consider 36wk surveillance growth. FH normal today at 28  4. History of gestational hypertension Continue low dose asa  5. Language  barrier Interpreter used  Preterm labor symptoms and general obstetric precautions including but not limited to vaginal bleeding, contractions, leaking of fluid and fetal movement were reviewed in detail with the patient. Please refer to After Visit Summary for other counseling recommendations.   Return in about 2 weeks (around 10/04/2020) for low risk ob, md or app, in person.  No future appointments.  [place] Bing, MD

## 2020-09-21 LAB — GLUCOSE TOLERANCE, 2 HOURS W/ 1HR
Glucose, 1 hour: 131 mg/dL (ref 65–179)
Glucose, 2 hour: 133 mg/dL (ref 65–152)
Glucose, Fasting: 82 mg/dL (ref 65–91)

## 2020-09-21 LAB — CBC
Hematocrit: 31.9 % — ABNORMAL LOW (ref 34.0–46.6)
Hemoglobin: 11 g/dL — ABNORMAL LOW (ref 11.1–15.9)
MCH: 32 pg (ref 26.6–33.0)
MCHC: 34.5 g/dL (ref 31.5–35.7)
MCV: 93 fL (ref 79–97)
Platelets: 181 10*3/uL (ref 150–450)
RBC: 3.44 x10E6/uL — ABNORMAL LOW (ref 3.77–5.28)
RDW: 13.1 % (ref 11.7–15.4)
WBC: 7.6 10*3/uL (ref 3.4–10.8)

## 2020-09-21 LAB — RPR: RPR Ser Ql: NONREACTIVE

## 2020-09-21 LAB — HIV ANTIBODY (ROUTINE TESTING W REFLEX): HIV Screen 4th Generation wRfx: NONREACTIVE

## 2020-10-05 ENCOUNTER — Ambulatory Visit (INDEPENDENT_AMBULATORY_CARE_PROVIDER_SITE_OTHER): Payer: Self-pay | Admitting: Obstetrics and Gynecology

## 2020-10-05 ENCOUNTER — Other Ambulatory Visit: Payer: Self-pay

## 2020-10-05 VITALS — BP 112/64 | HR 79 | Wt 164.7 lb

## 2020-10-05 DIAGNOSIS — Z8759 Personal history of other complications of pregnancy, childbirth and the puerperium: Secondary | ICD-10-CM

## 2020-10-05 DIAGNOSIS — O0990 Supervision of high risk pregnancy, unspecified, unspecified trimester: Secondary | ICD-10-CM

## 2020-10-05 DIAGNOSIS — O09522 Supervision of elderly multigravida, second trimester: Secondary | ICD-10-CM

## 2020-10-05 DIAGNOSIS — Z789 Other specified health status: Secondary | ICD-10-CM

## 2020-10-05 DIAGNOSIS — Z3A30 30 weeks gestation of pregnancy: Secondary | ICD-10-CM

## 2020-10-05 NOTE — Progress Notes (Signed)
   PRENATAL VISIT NOTE  Subjective:  Connie Boyd is a 35 y.o. [phone] at [redacted]w[redacted]d being seen today for ongoing prenatal care.  She is currently monitored for the following issues for this high-risk pregnancy and has Supervision of high risk pregnancy, antepartum; Limited prenatal care in third trimester; Advanced maternal age in multigravida, second trimester; Language barrier; and History of gestational hypertension on their problem list.  Patient reports no complaints.  Contractions: Irritability. Vag. Bleeding: None.  Movement: Present. Denies leaking of fluid.   The following portions of the patient's history were reviewed and updated as appropriate: allergies, current medications, past family history, past medical history, past social history, past surgical history and problem list.   Objective:   Vitals:   10/05/20 1059  BP: 112/64  Pulse: 79  Weight: 164 lb 11.2 oz (74.7 kg)    Fetal Status: Fetal Heart Rate (bpm): 144 Fundal Height: 31 cm Movement: Present     General:  Alert, oriented and cooperative. Patient is in no acute distress.  Skin: Skin is warm and dry. No rash noted.   Cardiovascular: Normal heart rate noted  Respiratory: Normal respiratory effort, no problems with respiration noted  Abdomen: Soft, gravid, appropriate for gestational age.  Pain/Pressure: Present     Pelvic: Cervical exam deferred        Extremities: Normal range of motion.  Edema: None  Mental Status: Normal mood and affect. Normal behavior. Normal judgment and thought content.   Assessment and Plan:  Pregnancy: [id number] at [redacted]w[redacted]d 1. [redacted] weeks gestation of pregnancy  2. Supervision of high risk pregnancy, antepartum 28wk labs normal  3. Advanced maternal age in multigravida, second trimester No issues  4. Language barrier Interpreter used  5. History of gestational hypertension No issues  Preterm labor symptoms and general obstetric precautions including but not limited to  vaginal bleeding, contractions, leaking of fluid and fetal movement were reviewed in detail with the patient. Please refer to After Visit Summary for other counseling recommendations.   Return in about 2 weeks (around 10/19/2020) for low risk ob, md or app, in person.  Future Appointments  Date Time Provider Department Center  10/23/2020 10:35 AM Marylene Land, CNM [facility]    Brigantine Bing, MD

## 2020-10-23 ENCOUNTER — Ambulatory Visit (INDEPENDENT_AMBULATORY_CARE_PROVIDER_SITE_OTHER): Payer: Self-pay | Admitting: Student

## 2020-10-23 ENCOUNTER — Other Ambulatory Visit: Payer: Self-pay

## 2020-10-23 VITALS — BP 110/62 | HR 88 | Wt 164.5 lb

## 2020-10-23 DIAGNOSIS — O0990 Supervision of high risk pregnancy, unspecified, unspecified trimester: Secondary | ICD-10-CM

## 2020-10-23 DIAGNOSIS — Z3A33 33 weeks gestation of pregnancy: Secondary | ICD-10-CM

## 2020-10-23 NOTE — Progress Notes (Signed)
   PRENATAL VISIT NOTE  Subjective:  Connie Boyd is a 35 y.o. [phone] at [redacted]w[redacted]d being seen today for ongoing prenatal care.  She is currently monitored for the following issues for this low-risk pregnancy and has Supervision of high risk pregnancy, antepartum; Limited prenatal care in third trimester; Advanced maternal age in multigravida, second trimester; Language barrier; and History of gestational hypertension on their problem list.  Patient reports no complaints.  Contractions: Irritability. Vag. Bleeding: None.  Movement: Present. Denies leaking of fluid.   The following portions of the patient's history were reviewed and updated as appropriate: allergies, current medications, past family history, past medical history, past social history, past surgical history and problem list.   Objective:   Vitals:   10/23/20 1057  BP: 110/62  Pulse: 88  Weight: 164 lb 8 oz (74.6 kg)    Fetal Status: Fetal Heart Rate (bpm): 145 Fundal Height: 33 cm Movement: Present     General:  Alert, oriented and cooperative. Patient is in no acute distress.  Skin: Skin is warm and dry. No rash noted.   Cardiovascular: Normal heart rate noted  Respiratory: Normal respiratory effort, no problems with respiration noted  Abdomen: Soft, gravid, appropriate for gestational age.  Pain/Pressure: Absent     Pelvic: Cervical exam deferred        Extremities: Normal range of motion.  Edema: None  Mental Status: Normal mood and affect. Normal behavior. Normal judgment and thought content.   Assessment and Plan:  Pregnancy: [id number] at [redacted]w[redacted]d  1. [redacted] weeks gestation of pregnancy   2. Supervision of high risk pregnancy, antepartum    -talked about vasectomy; information given on vasectomy clinic or private urology clinics, could consider payment plan. Has been on two IUDs and does not want; would like nexplanon at [facility] -continue taking ASA; bp is normal today and she is asymtomatic.  Preterm labor  symptoms and general obstetric precautions including but not limited to vaginal bleeding, contractions, leaking of fluid and fetal movement were reviewed in detail with the patient. Please refer to After Visit Summary for other counseling recommendations.   Return in about 3 weeks (around 11/13/2020), or LROB with KK.  Future Appointments  Date Time Provider Department Center  11/14/2020 10:35 AM Marylene Land, CNM [facility]    [name], [place]

## 2020-10-23 NOTE — Patient Instructions (Addendum)
Llame por el programa de vasectomia [phone].   [email or web address]

## 2020-10-23 NOTE — Progress Notes (Signed)
Patient stated that she has been having daily contractions that are not painful. Patient stated that she is doing well and do not have any questions or concerns

## 2020-11-14 ENCOUNTER — Ambulatory Visit (INDEPENDENT_AMBULATORY_CARE_PROVIDER_SITE_OTHER): Payer: Self-pay | Admitting: Student

## 2020-11-14 ENCOUNTER — Other Ambulatory Visit: Payer: Self-pay

## 2020-11-14 ENCOUNTER — Other Ambulatory Visit (HOSPITAL_COMMUNITY)
Admission: RE | Admit: 2020-11-14 | Discharge: 2020-11-14 | Disposition: A | Discharge status: Home / Self Care | Payer: Self-pay | Source: Ambulatory Visit | Attending: Student | Admitting: Student

## 2020-11-14 VITALS — BP 111/75 | HR 88 | Wt 165.6 lb

## 2020-11-14 DIAGNOSIS — O0990 Supervision of high risk pregnancy, unspecified, unspecified trimester: Secondary | ICD-10-CM | POA: Insufficient documentation

## 2020-11-14 DIAGNOSIS — Z3A36 36 weeks gestation of pregnancy: Secondary | ICD-10-CM

## 2020-11-14 NOTE — Progress Notes (Signed)
   PRENATAL VISIT NOTE  Subjective:  Connie Boyd is a 35 y.o. [id number] at [redacted]w[redacted]d being seen today for ongoing prenatal care.  She is currently monitored for the following issues for this low-risk pregnancy and has Supervision of high risk pregnancy, antepartum; Limited prenatal care in third trimester; Advanced maternal age in multigravida, second trimester; Language barrier; and History of gestational hypertension on their problem list.  Patient reports no complaints.  Contractions: Irritability. Vag. Bleeding: None.  Movement: Present. Denies leaking of fluid.   The following portions of the patient's history were reviewed and updated as appropriate: allergies, current medications, past family history, past medical history, past social history, past surgical history and problem list.   Objective:   Vitals:   11/14/20 1042  BP: 111/75  Pulse: 88  Weight: 165 lb 9.6 oz (75.1 kg)    Fetal Status: Fetal Heart Rate (bpm): 138 Fundal Height: 38 cm Movement: Present  Presentation: Vertex  General:  Alert, oriented and cooperative. Patient is in no acute distress.  Skin: Skin is warm and dry. No rash noted.   Cardiovascular: Normal heart rate noted  Respiratory: Normal respiratory effort, no problems with respiration noted  Abdomen: Soft, gravid, appropriate for gestational age.  Pain/Pressure: Present     Pelvic: Cervical exam performed in the presence of a chaperone Dilation: Fingertip Effacement (%): Thick    Extremities: Normal range of motion.  Edema: Trace  Mental Status: Normal mood and affect. Normal behavior. Normal judgment and thought content.   Assessment and Plan:  Pregnancy: [id number] at [redacted]w[redacted]d 1. Supervision of high risk pregnancy, antepartum -will do nexplanon at [facility] - GC/Chlamydia probe amp ([place])not at [facility] - Culture, beta strep (group b only) -discussed signs of labor, patient had questions about disposal of placenta; explained policy and patient  declines to take placenta home with her.  -BP is normal, no signs of pre-e -continue taking baby ASA Preterm labor symptoms and general obstetric precautions including but not limited to vaginal bleeding, contractions, leaking of fluid and fetal movement were reviewed in detail with the patient. Please refer to After Visit Summary for other counseling recommendations.   Return in about 2 weeks (around 11/28/2020), or LROb with KK or CNM.  Future Appointments  Date Time Provider Department Center  11/28/2020  9:55 AM Marvetta Gibbons, Brand Males, NP [facility]    Marylene Land, [place]

## 2020-11-15 LAB — GC/CHLAMYDIA PROBE AMP (~~LOC~~) NOT AT ARMC
Chlamydia: NEGATIVE
Comment: NEGATIVE
Comment: NORMAL
Neisseria Gonorrhea: NEGATIVE

## 2020-11-18 LAB — CULTURE, BETA STREP (GROUP B ONLY): Strep Gp B Culture: NEGATIVE

## 2020-11-28 ENCOUNTER — Ambulatory Visit (INDEPENDENT_AMBULATORY_CARE_PROVIDER_SITE_OTHER): Payer: Self-pay | Admitting: Family Medicine

## 2020-11-28 ENCOUNTER — Other Ambulatory Visit: Payer: Self-pay

## 2020-11-28 VITALS — BP 118/80 | HR 93 | Wt 164.5 lb

## 2020-11-28 DIAGNOSIS — Z8759 Personal history of other complications of pregnancy, childbirth and the puerperium: Secondary | ICD-10-CM

## 2020-11-28 DIAGNOSIS — Z3A38 38 weeks gestation of pregnancy: Secondary | ICD-10-CM

## 2020-11-28 DIAGNOSIS — O0990 Supervision of high risk pregnancy, unspecified, unspecified trimester: Secondary | ICD-10-CM

## 2020-11-28 DIAGNOSIS — Z3483 Encounter for supervision of other normal pregnancy, third trimester: Secondary | ICD-10-CM

## 2020-11-28 NOTE — Progress Notes (Signed)
   Subjective:  Connie Boyd is a 35 y.o. [id number] at [redacted]w[redacted]d being seen today for ongoing prenatal care.  She is currently monitored for the following issues for this high-risk pregnancy and has Supervision of high risk pregnancy, antepartum; Limited prenatal care in third trimester; Advanced maternal age in multigravida, second trimester; Language barrier; and History of gestational hypertension on their problem list.  Patient reports no complaints.  Contractions: Irritability. Vag. Bleeding: None.  Movement: Present. Denies leaking of fluid.   The following portions of the patient's history were reviewed and updated as appropriate: allergies, current medications, past family history, past medical history, past social history, past surgical history and problem list. Problem list updated.  Objective:   Vitals:   11/28/20 1008  BP: 118/80  Pulse: 93  Weight: 164 lb 8 oz (74.6 kg)    Fetal Status: Fetal Heart Rate (bpm): 143 Fundal Height: 39 cm Movement: Present     General:  Alert, oriented and cooperative. Patient is in no acute distress.  Skin: Skin is warm and dry. No rash noted.   Cardiovascular: Normal heart rate noted  Respiratory: Normal respiratory effort, no problems with respiration noted  Abdomen: Soft, gravid, appropriate for gestational age. Pain/Pressure: Present     Pelvic: Vag. Bleeding: None     Cervical exam performed  and found to be 1/20/-3      Extremities: Normal range of motion.  Edema: Trace  Mental Status: Normal mood and affect. Normal behavior. Normal judgment and thought content.   Assessment and Plan:  Pregnancy: [id number] at [redacted]w[redacted]d  1. Supervision of high risk pregnancy, antepartum Doing well and coping with pregnancy Patient is up to date on labs BP normal Intermittent irregular contractions without signs of labor Cervix 1cm dilated F/up in 1 week  2. History of gestational hypertension Hx of [name] in prior pregnancy.  BP range  normotensive, no signs of pre-eclampsia  Provided patient with information regarding preE warning signs  Continue taking ASA  Term labor symptoms and general obstetric precautions including but not limited to vaginal bleeding, contractions, leaking of fluid and fetal movement were reviewed in detail with the patient.  Please refer to After Visit Summary for other counseling recommendations.  Return in about 1 week (around 12/05/2020), or LROB, any provider.   Warner Mccreedy, MD, MPH OB Fellow, Faculty Practice

## 2020-11-28 NOTE — Patient Instructions (Addendum)
If the top number of your blood pressure is 140 or greater OR the bottom number is 90 or greater please seek medical care  Preeclampsia y eclampsia Preeclampsia and Eclampsia La preeclampsia es una afeccin grave que puede desarrollarse durante el [place]. Esta afeccin involucra presin arterial alta durante el embarazo y causa sntomas, como dolores de cabeza, cambios en la visin y Engineer, structural de la hinchazn en las piernas, las manos y Recruitment consultant. La preeclampsia se presenta despus de las 20 semanas de embarazo. La eclampsia es una crisis convulsiva que se produce por el empeoramiento de la preeclampsia. El diagnstico y el tratamiento tempranos de la preeclampsia son importantes. Si no se la trata deinmediato, puede causarles problemas graves a la madre y al beb. No hay cura para esta afeccin. Sin embargo, durante el [place], el parto del beb puede ser el mejor tratamiento para la preeclampsia o la eclampsia. En la [employer], los sntomas de la preeclampsia y la eclampsia desaparecen despus de dar a Patent examiner. En casos poco frecuentes, una mujer puede desarrollar preeclampsia o eclampsia despus de dar a luz. Esto normalmente ocurre dentro de las 48 horas despus del nacimiento del beb, pero puedeaparecer hasta 6 semanas despus de dar a luz. Cules son las causas? Se desconoce la causa de esta afeccin. Qu incrementa el riesgo? Los siguientes factores pueden hacer que usted sea ms propensa a Warehouse manager preeclampsia: Estar embarazada por primera vez o de ms de un beb. Haber tenido preeclampsia o una afeccin llamada hemlisis, aumento de las enzimas hepticas, y sndrome de bajo recuento de plaquetas (HELLP)en un embarazo anterior. Tener antecedentes familiares de preeclampsia. Tener ms de 35 aos. Ser obesa. Haber quedado embarazada a travs de tratamientos de fertilidad. Las afecciones que reducen el flujo sanguneo o el oxgeno a la placenta y al beb tambin pueden aumentar el  riesgo. Estas incluyen: Presin arterial alta antes, durante o inmediatamente despus del embarazo. Enfermedad renal. Diabetes. Trastornos de Doctor, hospital de Risk manager. Enfermedades autoinmunitarias, como el lupus. Apnea del sueo. Cules son los signos o sntomas? Los sntomas frecuentes de esta afeccin incluyen los siguientes: Dolor de [place] intenso y punzante que no desaparece. Problemas visuales, como visin doble o borrosa y sensibilidad a la luz. Dolor en el estmago, especialmente en la regin superior derecha. Dolor en el hombro. Otros sntomas que IT consultant a medida que la afeccin empeora incluyen: Aumento repentino de peso debido a la acumulacin de lquido en el cuerpo. Esto causa hinchazn en el rostro, las manos las piernas y los pies. Nuseas y vmitos intensos. Orinar menos que lo habitual. Falta de aire. Convulsiones. Cmo se diagnostica? El mdico le har preguntas sobre los sntomas y verificar la presencia de signos de preeclampsia durante las visitas prenatales. Tambin le harn pruebas de rutina, entre ellas: Control de la presin arterial. Anlisis de orina para detectar la presencia de protenas. Anlisis de sangre para evaluar la funcin de los rganos. Control de la frecuencia cardaca del beb. Ecografas para verificar el crecimiento fetal. Cmo se trata? Usted junto con su mdico determinarn el mejor tratamiento para usted. El tratamiento puede incluir: Visitas prenatales frecuentes para Landscape architect presencia de preeclampsia. Medicamentos para bajar la presin arterial. Medicamentos para prevenir convulsiones. Aspirina de dosis baja durante el embarazo. [employer], en los casos graves. Le darn medicamentos para controlar la presin arterial y la cantidad de lquido en el cuerpo. Dar a luz al beb. Trabaje con el mdico para Mudlogger  mdica crnica que tenga, como diabetes o problemas renales. Tambin trabaje  con el mdico para manejarel aumento de peso durante el [place]. Siga estas instrucciones en su casa: Comida y bebida Beber suficiente lquido como para Pharmacologist la orina de color amarillo plido. Evite la cafena. La cafena puede aumentar la presin arterial y la frecuencia cardaca, y causar deshidratacin. Reduzca la cantidad de sal que ingiere. Estilo de vida No consuma ningn producto que contenga nicotina o tabaco. Estos productos incluyen cigarrillos, tabaco para Theatre manager y aparatos de vapeo, como los Administrator, Civil Service. Si necesita ayuda para dejar de fumar, consulte al mdico. No consuma drogas ni alcohol. Evite las situaciones estresantes tanto como sea posible. Haga reposo y [place]. Instrucciones generales  Use los medicamentos de venta libre y los recetados solamente como se lo haya indicado el mdico. Acustese del lado izquierdo mientras hace reposo. Esto mantiene la presin fuera de los vasos sanguneos principales. Levante (eleve) los pies mientras est sentada o [place]. Pruebe colocarse almohadas debajo de las pantorrillas. Haga ejercicio regularmente. Consulte al mdico qu tipos de ejercicios son seguros para usted. Mida su presin arterial con la frecuencia recomendada por su mdico. Concurra a todas las visitas prenatales y de seguimiento. Esto es importante.  Comunquese con un mdico si: Tiene sntomas que pueden requerir tratamiento o supervisin ms estrecha. Estos incluyen: Dolores de [place]. Dolor de Teaching laboratory technician o nuseas y vmitos. Dolor en el hombro. Problemas de visin, como manchas frente a los ojos o visin borrosa. Aumento repentino de peso o aumento de la hinchazn en el rostro, las [place], las piernas y [employer]. Aumento de la ansiedad o sensacin de muerte inminente. Signos o sntomas de [place]. Solicite ayuda de inmediato si: Presenta alguno de los siguientes sntomas: Una convulsin. Falta de aire o dificultad para  respirar. Dificultad para hablar o arrastrar las palabras. Desmayos. Dolor de pecho. Estos sntomas pueden representar un problema grave que constituye Radio broadcast assistant. No espere a ver si los sntomas desaparecen. Solicite atencin mdica de inmediato. Comunquese con el servicio de emergencias de su localidad (911 en los [place]). No conduzca por sus propios medios [employer] hospital. Resumen La preeclampsia es una afeccin grave que puede desarrollarse durante el [place]. El diagnstico y el tratamiento tempranos de la preeclampsia son Donavan [place]. Concurra a todas las visitas prenatales y de seguimiento. Esto es importante. Obtenga ayuda de inmediato si tiene una convulsin, le falta el aire o tiene dificultad para Industrial/product designer, tiene problemas para hablar o arrastra las palabras, tiene dolor en el pecho o se [place]. Esta informacin no tiene Theme park manager el consejo del mdico. Asegresede hacerle al mdico cualquier pregunta que tenga. Document Revised: 02/23/2020 Document Reviewed: 02/23/2020 Elsevier Patient Education  2022 [employer].

## 2020-12-05 ENCOUNTER — Ambulatory Visit (INDEPENDENT_AMBULATORY_CARE_PROVIDER_SITE_OTHER): Payer: Self-pay | Admitting: Family Medicine

## 2020-12-05 ENCOUNTER — Other Ambulatory Visit: Payer: Self-pay

## 2020-12-05 VITALS — BP 122/78 | HR 88 | Wt 165.8 lb

## 2020-12-05 DIAGNOSIS — Z8759 Personal history of other complications of pregnancy, childbirth and the puerperium: Secondary | ICD-10-CM

## 2020-12-05 DIAGNOSIS — O09522 Supervision of elderly multigravida, second trimester: Secondary | ICD-10-CM

## 2020-12-05 DIAGNOSIS — O0990 Supervision of high risk pregnancy, unspecified, unspecified trimester: Secondary | ICD-10-CM

## 2020-12-05 DIAGNOSIS — Z789 Other specified health status: Secondary | ICD-10-CM

## 2020-12-05 NOTE — Progress Notes (Signed)
   Subjective:  Connie Boyd is a 35 y.o. [id number] at [redacted]w[redacted]d being seen today for ongoing prenatal care.  She is currently monitored for the following issues for this low-risk pregnancy and has Supervision of high risk pregnancy, antepartum; Limited prenatal care in third trimester; Advanced maternal age in multigravida, second trimester; Language barrier; and History of gestational hypertension on their problem list.  Patient reports no complaints.  Contractions: Irritability. Vag. Bleeding: None.  Movement: Present. Denies leaking of fluid.   The following portions of the patient's history were reviewed and updated as appropriate: allergies, current medications, past family history, past medical history, past social history, past surgical history and problem list. Problem list updated.  Objective:   Vitals:   12/05/20 0906  BP: 122/78  Pulse: 88  Weight: 165 lb 12.8 oz (75.2 kg)    Fetal Status: Fetal Heart Rate (bpm): 137   Movement: Present     General:  Alert, oriented and cooperative. Patient is in no acute distress.  Skin: Skin is warm and dry. No rash noted.   Cardiovascular: Normal heart rate noted  Respiratory: Normal respiratory effort, no problems with respiration noted  Abdomen: Soft, gravid, appropriate for gestational age. Pain/Pressure: Present     Pelvic: Vag. Bleeding: None     Cervical exam deferred        Extremities: Normal range of motion.  Edema: Trace  Mental Status: Normal mood and affect. Normal behavior. Normal judgment and thought content.   Urinalysis:      Assessment and Plan:  Pregnancy: [id number] at [redacted]w[redacted]d  1. Supervision of high risk pregnancy, antepartum BP and FHR normal Presentation confirmed with [place] Scheduled for PD IOL at [redacted]w[redacted]d, orders placed, form faxed, confirmed there are no Faculty inductions that day at present Post dates testing with next visit  2. Advanced maternal age in multigravida, second trimester   3. Language  barrier Spanish  4. History of gestational hypertension On ASA, BP normal  Term labor symptoms and general obstetric precautions including but not limited to vaginal bleeding, contractions, leaking of fluid and fetal movement were reviewed in detail with the patient. Please refer to After Visit Summary for other counseling recommendations.  Return in about 1 week (around 12/12/2020) for [facility], ob visit.   Venora Maples, MD

## 2020-12-08 ENCOUNTER — Telehealth (HOSPITAL_COMMUNITY): Payer: Self-pay | Admitting: *Deleted

## 2020-12-08 NOTE — Telephone Encounter (Signed)
[id number] 

## 2020-12-11 ENCOUNTER — Telehealth (HOSPITAL_COMMUNITY): Payer: Self-pay | Admitting: *Deleted

## 2020-12-11 ENCOUNTER — Encounter (HOSPITAL_COMMUNITY): Payer: Self-pay | Admitting: *Deleted

## 2020-12-11 ENCOUNTER — Encounter (HOSPITAL_COMMUNITY): Payer: Self-pay

## 2020-12-11 NOTE — Telephone Encounter (Signed)
Preadmission screen Interpreter number [phone]

## 2020-12-13 ENCOUNTER — Inpatient Hospital Stay (HOSPITAL_COMMUNITY): Admission: AD | Admit: 2020-12-13 | Payer: Self-pay | Source: Home / Self Care | Admitting: Obstetrics & Gynecology

## 2020-12-13 ENCOUNTER — Other Ambulatory Visit: Payer: Self-pay

## 2020-12-13 ENCOUNTER — Ambulatory Visit: Payer: Self-pay | Admitting: *Deleted

## 2020-12-13 ENCOUNTER — Inpatient Hospital Stay (HOSPITAL_COMMUNITY)
Admission: AD | Admit: 2020-12-13 | Discharge: 2020-12-15 | DRG: 807 | Disposition: A | Discharge status: Home / Self Care | Payer: Medicaid Other | Attending: Obstetrics and Gynecology | Admitting: Obstetrics and Gynecology

## 2020-12-13 ENCOUNTER — Ambulatory Visit (INDEPENDENT_AMBULATORY_CARE_PROVIDER_SITE_OTHER): Payer: Self-pay | Admitting: Obstetrics and Gynecology

## 2020-12-13 ENCOUNTER — Ambulatory Visit (INDEPENDENT_AMBULATORY_CARE_PROVIDER_SITE_OTHER): Payer: Self-pay

## 2020-12-13 ENCOUNTER — Encounter: Payer: Self-pay | Admitting: Obstetrics and Gynecology

## 2020-12-13 ENCOUNTER — Encounter (HOSPITAL_COMMUNITY): Payer: Self-pay | Admitting: Family Medicine

## 2020-12-13 VITALS — BP 148/87 | HR 78 | Wt 168.8 lb

## 2020-12-13 DIAGNOSIS — Z8759 Personal history of other complications of pregnancy, childbirth and the puerperium: Secondary | ICD-10-CM

## 2020-12-13 DIAGNOSIS — Z20822 Contact with and (suspected) exposure to covid-19: Secondary | ICD-10-CM | POA: Diagnosis present

## 2020-12-13 DIAGNOSIS — O0990 Supervision of high risk pregnancy, unspecified, unspecified trimester: Secondary | ICD-10-CM

## 2020-12-13 DIAGNOSIS — O134 Gestational [pregnancy-induced] hypertension without significant proteinuria, complicating childbirth: Principal | ICD-10-CM | POA: Diagnosis present

## 2020-12-13 DIAGNOSIS — O09522 Supervision of elderly multigravida, second trimester: Secondary | ICD-10-CM

## 2020-12-13 DIAGNOSIS — O132 Gestational [pregnancy-induced] hypertension without significant proteinuria, second trimester: Secondary | ICD-10-CM

## 2020-12-13 DIAGNOSIS — R030 Elevated blood-pressure reading, without diagnosis of hypertension: Secondary | ICD-10-CM | POA: Diagnosis present

## 2020-12-13 DIAGNOSIS — O480 Post-term pregnancy: Secondary | ICD-10-CM | POA: Diagnosis present

## 2020-12-13 DIAGNOSIS — Z3A40 40 weeks gestation of pregnancy: Secondary | ICD-10-CM

## 2020-12-13 DIAGNOSIS — O139 Gestational [pregnancy-induced] hypertension without significant proteinuria, unspecified trimester: Secondary | ICD-10-CM | POA: Diagnosis present

## 2020-12-13 DIAGNOSIS — Z789 Other specified health status: Secondary | ICD-10-CM | POA: Diagnosis present

## 2020-12-13 LAB — COMPREHENSIVE METABOLIC PANEL
ALT: 12 U/L (ref 0–44)
AST: 18 U/L (ref 15–41)
Albumin: 3.2 g/dL — ABNORMAL LOW (ref 3.5–5.0)
Alkaline Phosphatase: 168 U/L — ABNORMAL HIGH (ref 38–126)
Anion gap: 9 (ref 5–15)
BUN: 8 mg/dL (ref 6–20)
CO2: 21 mmol/L — ABNORMAL LOW (ref 22–32)
Calcium: 9 mg/dL (ref 8.9–10.3)
Chloride: 104 mmol/L (ref 98–111)
Creatinine, Ser: 0.51 mg/dL (ref 0.44–1.00)
GFR, Estimated: >60 mL/min (ref >60)
Glucose, Bld: 75 mg/dL (ref 70–99)
Potassium: 3.8 mmol/L (ref 3.5–5.1)
Sodium: 134 mmol/L — ABNORMAL LOW (ref 135–145)
Total Bilirubin: 0.3 mg/dL (ref 0.3–1.2)
Total Protein: 6.2 g/dL — ABNORMAL LOW (ref 6.5–8.1)

## 2020-12-13 LAB — CBC
HCT: 33 % — ABNORMAL LOW (ref 36.0–46.0)
Hemoglobin: 10.8 g/dL — ABNORMAL LOW (ref 12.0–15.0)
MCH: 28.6 pg (ref 26.0–34.0)
MCHC: 32.7 g/dL (ref 30.0–36.0)
MCV: 87.3 fL (ref 80.0–100.0)
Platelets: 212 10*3/uL (ref 150–400)
RBC: 3.78 MIL/uL — ABNORMAL LOW (ref 3.87–5.11)
RDW: 13.2 % (ref 11.5–15.5)
WBC: 7.7 10*3/uL (ref 4.0–10.5)
nRBC: 0 % (ref 0.0–0.2)

## 2020-12-13 LAB — TYPE AND SCREEN
ABO/RH(D): O POS
Antibody Screen: NEGATIVE

## 2020-12-13 LAB — RESP PANEL BY RT-PCR (FLU A&B, COVID) ARPGX2
Influenza A by PCR: NEGATIVE
Influenza B by PCR: NEGATIVE
SARS Coronavirus 2 by RT PCR: NEGATIVE

## 2020-12-13 LAB — PROTEIN / CREATININE RATIO, URINE
Creatinine, Urine: 57.62 mg/dL
Total Protein, Urine: <6 mg/dL

## 2020-12-13 MED ORDER — ACETAMINOPHEN 325 MG PO TABS
650.0000 mg | ORAL_TABLET | ORAL | Status: DC | PRN
  Administered 2020-12-13 (×2): 650 mg via ORAL
  Filled 2020-12-13 (×2): qty 2

## 2020-12-13 MED ORDER — LACTATED RINGERS IV SOLN: INTRAVENOUS | Status: DC

## 2020-12-13 MED ORDER — MISOPROSTOL 25 MCG QUARTER TABLET: 25.0000 ug | ORAL_TABLET | ORAL | Status: DC | PRN

## 2020-12-13 MED ORDER — OXYTOCIN-SODIUM CHLORIDE 30-0.9 UT/500ML-% IV SOLN
2.5000 [IU]/h | INTRAVENOUS | Status: DC
  Administered 2020-12-13: 2.5 [IU]/h via INTRAVENOUS
  Filled 2020-12-13: qty 500

## 2020-12-13 MED ORDER — LACTATED RINGERS IV SOLN: 500.0000 mL | INTRAVENOUS | Status: DC | PRN

## 2020-12-13 MED ORDER — FENTANYL CITRATE (PF) 100 MCG/2ML IJ SOLN: 50.0000 ug | INTRAMUSCULAR | Status: DC | PRN

## 2020-12-13 MED ORDER — LIDOCAINE HCL (PF) 1 % IJ SOLN: 30.0000 mL | INTRAMUSCULAR | Status: DC | PRN

## 2020-12-13 MED ORDER — ONDANSETRON HCL 4 MG/2ML IJ SOLN: 4.0000 mg | Freq: Four times a day (QID) | INTRAMUSCULAR | Status: DC | PRN

## 2020-12-13 MED ORDER — FENTANYL CITRATE (PF) 100 MCG/2ML IJ SOLN
50.0000 ug | INTRAMUSCULAR | Status: DC | PRN
  Administered 2020-12-13: 100 ug via INTRAVENOUS
  Administered 2020-12-13: 50 ug via INTRAVENOUS
  Administered 2020-12-13: 100 ug via INTRAVENOUS
  Filled 2020-12-13 (×3): qty 2

## 2020-12-13 MED ORDER — TERBUTALINE SULFATE 1 MG/ML IJ SOLN: 0.2500 mg | Freq: Once | INTRAMUSCULAR | Status: DC | PRN

## 2020-12-13 MED ORDER — SOD CITRATE-CITRIC ACID 500-334 MG/5ML PO SOLN: 30.0000 mL | ORAL | Status: DC | PRN

## 2020-12-13 MED ORDER — MISOPROSTOL 50MCG HALF TABLET
50.0000 ug | ORAL_TABLET | ORAL | Status: DC | PRN
  Administered 2020-12-13 (×2): 50 ug via ORAL
  Filled 2020-12-13 (×2): qty 1

## 2020-12-13 MED ORDER — OXYTOCIN BOLUS FROM INFUSION
333.0000 mL | Freq: Once | INTRAVENOUS | Status: AC
  Administered 2020-12-13: 333 mL via INTRAVENOUS

## 2020-12-13 NOTE — H&P (Addendum)
OBSTETRIC ADMISSION HISTORY AND PHYSICAL  Connie Boyd Connie Boyd is a 35 y.o. female [phone] with IUP at [redacted]w[redacted]d by midtrimester [place] presenting for IOL due to elevated BP. She reports +FMs, No LOF, no VB, no blurry vision, or peripheral edema, and RUQ pain. Mild HA just started.  She plans on breast feeding. She requests nexplanon for birth control. She received her prenatal care at  [facility]    Dating: By [redacted]w[redacted]d [place] --->  Estimated Date of Delivery: 12/08/20  Sono:    @[redacted]w[redacted]d , CWD, normal anatomy, breech presentation, posterior placental lie, 260g, 17% EFW  Prenatal History/Complications:  AMA History of [name]- now with elevated blood pressure in office Spanish speaking [employer]  Past Medical History: Past Medical History:  Diagnosis Date   Pregnancy induced hypertension     Past Surgical History: Past Surgical History:  Procedure Laterality Date   APPENDECTOMY     OVARIAN CYST REMOVAL      Obstetrical History: OB History     Gravida  6   Para  4   Term  4   Preterm  0   AB  1   Living  4      SAB  1   IAB  0   Ectopic  0   Multiple  0   Live Births  4           Social History Social History   Socioeconomic History   Marital status: Single    Spouse name: Not on file   Number of children: Not on file   Years of education: Not on file   Highest education level: Not on file  Occupational History   Not on file  Tobacco Use   Smoking status: Never   Smokeless tobacco: Never  Vaping Use   Vaping Use: Never used  Substance and Sexual Activity   Alcohol use: Yes    Alcohol/week: 1.0 standard drink    Types: 1 Cans of beer per week   Drug use: Never   Sexual activity: Yes    Birth control/protection: Pill  Other Topics Concern   Not on file  Social History Narrative   Not on file   Social Determinants of Health   Financial Resource Strain: Not on file  Food Insecurity: No Food Insecurity   Worried About Running Out of Food in the Last  Year: Never true   Ran Out of Food in the Last Year: Never true  Transportation Needs: No Transportation Needs   Lack of Transportation (Medical): No   Lack of Transportation (Non-Medical): No  Physical Activity: Not on file  Stress: Not on file  Social Connections: Not on file    Family History: Family History  Problem Relation Age of Onset   Cancer Mother    Cancer Maternal Aunt    Cancer Paternal Uncle    Diabetes Paternal Grandfather    Anesthesia problems Neg Hx    Other Neg Hx     Allergies: No Known Allergies  No medications prior to admission.     Review of Systems   All systems reviewed and negative except as stated in HPI  Last menstrual period 02/04/2020, unknown if currently breastfeeding. General appearance: alert, cooperative, appears stated age, and no distress Lungs: clear to auscultation bilaterally Heart: regular rate and rhythm Abdomen: soft, non-tender; bowel sounds normal Pelvic: normal external female genitalia, CVE 1/thick/ballotable Extremities: Homans sign is negative, no sign of DVT DTR's 2+ Presentation: cephalic Fetal monitoringBaseline: 140 bpm, Variability: Good {>  6 bpm), Accelerations: Reactive, and Decelerations: Absent Uterine activity regular q3-28m     Prenatal labs: ABO, Rh: O/Positive/-- (02/08 1222) Antibody: Negative (02/08 1222) Rubella: 6.62 (02/08 1222) RPR: Non Reactive (05/25 0830)  HBsAg: Negative (02/08 1222)  HIV: Non Reactive (05/25 0830)  GBS: Negative/-- (07/19 1104)  1 hr Glucola Normal 2 hr GTT Genetic screening  no screening done Anatomy US WNL  Prenatal Transfer Tool  Maternal Diabetes: No Genetic Screening: Declined Maternal Ultrasounds/Referrals: Normal Fetal Ultrasounds or other Referrals:  None Maternal Substance Abuse:  No Significant Maternal Medications:  None Significant Maternal Lab Results: Group B Strep negative  No results found for this or any previous visit (from the past 24  hour(s)).  Patient Active Problem List   Diagnosis Date Noted   Post-dates pregnancy 12/13/2020   History of gestational hypertension 09/20/2020   Advanced maternal age in multigravida, second trimester 08/02/2020   Language barrier 08/02/2020   Limited prenatal care in third trimester 10/15/2018   Supervision of high risk pregnancy, antepartum 06/01/2018    Assessment/Plan:  Connie Boyd is a 35 y.o. [id number] at [redacted]w[redacted]d here for IOL due to elevated blood pressure in clinic today.  #Labor: Will start with cytotec/FB. Will progress to pitocin as able.  #Elevated BP: patient has a history of [name] in prior pregnancy, had normal BP throughout this pregnancy until today in clinic where she has had Bps 140-150s/70s-80s. She has a mild HA that just started. Will obtain CMP, CBC, and urine protein creatinine and monitor BP as well as for any signs of Pre-E.  #Pain: IV pain meds #FWB: Cat I #ID:  GBS neg #MOF: breast #MOC: interval BTL #Circ:  N/a  Shirlean Mylar, MD  12/13/2020, 10:58 AM  CNM attestation:  I have seen and examined this patient; I agree with above documentation in the resident's note.   Connie Boyd is a 35 y.o. [phone] here for IOL due to [name]  PE: BP (!) 154/70   Pulse 75   Temp 98.3 F (36.8 C) (Oral)   Resp 18   Ht 5\' 3"  (1.6 m)   Wt 76.9 kg   LMP 02/04/2020 (Exact Date)   BMI 30.03 kg/m  Gen: calm comfortable, NAD Resp: normal effort, no distress Abd: gravid  ROS, labs, PMH reviewed  Plan: Admit to Labor and Delivery Plan cx ripening with cytotec/cervical foley to start, followed by Pit/AROM prn Watch BPs- if they reach severe range will have dx of pre-e and need mag sulfate Anticipate vag del  04/05/2020 CNM 12/13/2020, 6:34 PM

## 2020-12-13 NOTE — Patient Instructions (Signed)
Vaginal Delivery ?Vaginal delivery means that you give birth by pushing your baby out of your birth canal (vagina). Your health care team will help you before, during, and after vaginal delivery. ?Birth experiences are unique for every woman and every pregnancy, and birth experiences vary depending on where you choose to give birth. ?What are the risks and benefits? ?Generally, this is safe. However, problems may occur, including: ?Bleeding. ?Infection. ?Damage to other structures such as vaginal tearing. ?Allergic reactions to medicines. ?Despite the risks, benefits of vaginal delivery include less risk of bleeding and infection and a shorter recovery time compared to a Cesarean delivery. Cesarean delivery, or C-section, is the surgical delivery of a baby. ?What happens when I arrive at the birth center or hospital? ?Once you are in labor and have been admitted into the hospital or birth center, your health care team may: ?Review your pregnancy history and any concerns that you have. ?Talk with you about your birth plan and discuss pain control options. ?Check your blood pressure, breathing, and heartbeat. ?Assess your baby's heartbeat. ?Monitor your uterus for contractions. ?Check whether your bag of water (amniotic sac) has broken (ruptured). ?Insert an IV into one of your veins. This may be used to give you fluids and medicines. ?Monitoring ?Your health care team may assess your contractions (uterine monitoring) and your baby's heart rate (fetal monitoring). You may need to be monitored: ?Often, but not continuously (intermittently). ?All the time or for long periods at a time (continuously). Continuous monitoring may be needed if: ?You are taking certain medicines, such as medicine to relieve pain or make your contractions stronger. ?You have pregnancy or labor complications. ?Monitoring may be done by: ?Placing a special stethoscope or a handheld monitoring device on your abdomen to check your baby's heartbeat  and to check for contractions. ?Placing monitors on your abdomen (external monitors) to record your baby's heartbeat and the frequency and length of contractions. ?Placing monitors inside your uterus through your vagina (internal monitors) to record your baby's heartbeat and the frequency, length, and strength of your contractions. Depending on the type of monitor, it may remain in your uterus or on your baby's head until birth. ?Telemetry. This is a type of continuous monitoring that can be done with external or internal monitors. Instead of having to stay in bed, you are able to move around. ?Physical exam ?Your health care team may perform frequent physical exams. This may include: ?Checking how and where your baby is positioned in your uterus. ?Checking your cervix to determine: ?Whether it is thinning out (effacing). ?Whether it is opening up (dilating). ?What happens during labor and delivery? ?Normal labor and delivery is divided into the following three stages: ?Stage 1 ?This is the longest stage of labor. ?Throughout this stage, you will feel contractions. Contractions generally feel mild, infrequent, and irregular at first. They get stronger, more frequent, and more regular as you move through this stage. You may have contractions about every 2-3 minutes. ?This stage ends when your cervix is completely dilated to 4 inches (10 cm) and completely effaced. ?Stage 2 ?This stage starts once your cervix is completely effaced and dilated and lasts until the delivery of your baby. ?This is the stage where you will feel an urge to push your baby out of your vagina. ?You may feel stretching and burning pain, especially when the widest part of your baby's head passes through the vaginal opening (crowning). ?Once your baby is delivered, the umbilical cord will be clamped and   cut. Timing of cutting the cord will depend on your wishes, your baby's health, and your health care provider's practices. ?Your baby will be  placed on your bare chest (skin-to-skin contact) in an upright position and covered with a warm blanket. If you are choosing to breastfeed, watch your baby for feeding cues, like rooting or sucking, and help the baby to your breast for his or her first feeding. ?Stage 3 ?This stage starts immediately after the birth of your baby and ends after you deliver the placenta. ?This stage may take anywhere from 5 to 30 minutes. ?After your baby has been delivered, you will feel contractions as your body expels the placenta. These contractions also help your uterus get smaller and reduce bleeding. ?What can I expect after labor and delivery? ?After labor is over, you and your baby will be assessed closely until you are ready to go home. Your health care team will teach you how to care for yourself and your baby. ?You and your baby may be encouraged to stay in the same room (rooming in) during your hospital stay. This will help promote early bonding and successful breastfeeding. ?Your uterus will be checked and massaged regularly (fundal massage). ?You may continue to receive fluids and medicines through an IV. ?You will have some soreness and pain in your abdomen, vagina, and the area of skin between your vaginal opening and your anus (perineum). ?If an incision was made near your vagina (episiotomy) or if you had some vaginal tearing during delivery, cold compresses may be placed on your episiotomy or your tear. This helps to reduce pain and swelling. ?It is normal to have vaginal bleeding after delivery. Wear a sanitary pad for vaginal bleeding and discharge. ?Summary ?Vaginal delivery means that you will give birth by pushing your baby out of your birth canal (vagina). ?Your health care team will monitor you and your baby throughout the stages of labor. ?After you deliver your baby, your health care team will continue to assess you and your baby to ensure you are both recovering as expected after delivery. ?This  information is not intended to replace advice given to you by your health care provider. Make sure you discuss any questions you have with your health care provider. ?Document Revised: 03/13/2020 Document Reviewed: 03/13/2020 ?Elsevier Patient Education ? 2022 Else[employer] Inc[employer] ? ?

## 2020-12-13 NOTE — Progress Notes (Signed)
Connie Boyd is a 35 y.o. [id number] at [redacted]w[redacted]d by ultrasound admitted for induction of labor due to elevated BP.  Subjective: FB still in, patient uncomfortable with ctx, breathing through them, spouse at bedside.  Objective: BP 140/83   Pulse 78   Temp 98.3 F (36.8 C) (Oral)   Resp 18   Ht 5\' 3"  (1.6 m)   Wt 76.9 kg   LMP 02/04/2020 (Exact Date)   BMI 30.03 kg/m  No intake/output data recorded. No intake/output data recorded.  FHT:  FHR: 140 bpm, variability: moderate,  accelerations:  Present,  decelerations:  Absent UC:   regular, every q2-3 minutes SVE:   Dilation: 1 Effacement (%): Thick Station: Ballotable Exam by:: [email or web address] CNM  Labs: Lab Results  Component Value Date   WBC 7.7 12/13/2020   HGB 10.8 (L) 12/13/2020   HCT 33.0 (L) 12/13/2020   MCV 87.3 12/13/2020   PLT 212 12/13/2020    Assessment / Plan: Induction of labor due to gestational hypertension.  Labor:  s/p cyto x2 + FB. Will add pitocin when able. Preeclampsia:   [name] for now, will continue to closely monitor BP and treat if severe range BP occurs. CBC, CMP, urine P/C ration WNL. Fetal Wellbeing:  Category I Pain Control:  IV pain meds I/D:   GBS neg Anticipated MOD:  NSVD  12/15/2020 12/13/2020, 6:26 PM

## 2020-12-13 NOTE — Progress Notes (Signed)
Pt with elevated BP today x2. She reports H/A yesterday, however none today. She denies all visual disturbances. Pt states she had IOL @ 36 wks with last pregnancy 2 years ago d/t pre-e.    IOL is currently scheduled on 8/19.

## 2020-12-13 NOTE — Progress Notes (Signed)
Subjective:  Connie Boyd is a 35 y.o. [id number] at [redacted]w[redacted]d being seen today for ongoing prenatal care.  She is currently monitored for the following issues for this high-risk pregnancy and has Supervision of high risk pregnancy, antepartum; Limited prenatal care in third trimester; Advanced maternal age in multigravida, second trimester; Language barrier; History of gestational hypertension; and Post-dates pregnancy on their problem list.  Patient reports HA and visual changes the other day. Denies today.  Contractions: Irregular. Vag. Bleeding: None.  Movement: Present. Denies leaking of fluid.   The following portions of the patient's history were reviewed and updated as appropriate: allergies, current medications, past family history, past medical history, past social history, past surgical history and problem list. Problem list updated.  Objective:   Vitals:   12/13/20 0857 12/13/20 0904  BP: (!) 151/79 (!) 148/87  Pulse: 78   Weight: 168 lb 12.8 oz (76.6 kg)     Fetal Status: Fetal Heart Rate (bpm): NST   Movement: Present     General:  Alert, oriented and cooperative. Patient is in no acute distress.  Skin: Skin is warm and dry. No rash noted.   Cardiovascular: Normal heart rate noted  Respiratory: Normal respiratory effort, no problems with respiration noted  Abdomen: Soft, gravid, appropriate for gestational age. Pain/Pressure: Present     Pelvic:  Cervical exam deferred        Extremities: Normal range of motion.     Mental Status: Normal mood and affect. Normal behavior. Normal judgment and thought content.   Urinalysis:      Assessment and Plan:  Pregnancy: [id number] at [redacted]w[redacted]d  1. Supervision of high risk pregnancy, antepartum Stable   2. History of gestational hypertension [name] now based on BP Will send to L & D for IOL Dr. Kirtland Bouchard. [place] made aware Orders placed  3. Advanced maternal age in multigravida, second trimester   4. Post-term pregnancy, 40-42 weeks  of gestation See above BPP 10/10 today  Term labor symptoms and general obstetric precautions including but not limited to vaginal bleeding, contractions, leaking of fluid and fetal movement were reviewed in detail with the patient. Please refer to After Visit Summary for other counseling recommendations.  No follow-ups on file.   Hermina Staggers, MD

## 2020-12-13 NOTE — Discharge Summary (Addendum)
Postpartum Discharge Summary     Patient Name: Connie Boyd DOB: 1985/06/28 MRN: [id number]  Date of admission: 12/13/2020 Delivery date:12/13/2020  Delivering provider: Delora Fuel  Date of discharge: 12/15/2020  Admitting diagnosis: Post-dates pregnancy [O48.0] Intrauterine pregnancy: [redacted]w[redacted]d    Secondary diagnosis:  Active Problems:   Gestational hypertension   Advanced maternal age in multigravida, second trimester   Language barrier   Post-dates pregnancy  Additional problems: none    Discharge diagnosis: Term Pregnancy Delivered and Gestational Hypertension                                              Post partum procedures: none Augmentation: Cytotec and IP Foley Complications: None  Hospital course: Induction of Labor With Vaginal Delivery   35y.o. yo [id number] admitted to the hospital 12/13/2020 for induction of labor.  Indication for induction: Gestational hypertension dx during her office visit today, neg pre-e labs and asymptomatic. Patient had an uncomplicated labor course, getting into active labor after cervical foley and cytotec x 2 doses. Membrane Rupture Time/Date: 9:41 PM ,12/13/2020   Delivery Method:Vaginal, Spontaneous  Episiotomy: None  Lacerations:  None  Details of delivery can be found in separate delivery note.  Patient had a routine postpartum course. SBP >130 x3 so patient started on Nifedipine at discharge.  Patient is discharged home 12/15/20.  Newborn Data: Birth date:12/13/2020  Birth time:11:12 PM  Gender:Female  Living status:Living  Apgars:8 ,9  Weight:3810 g (8lb 6.4oz)  Magnesium Sulfate received: No BMZ received: No Rhophylac:N/A MMR:N/A T-DaP:Given prenatally Flu: Yes Transfusion:No  Physical exam  Vitals:   12/14/20 0954 12/14/20 1412 12/14/20 2200 12/15/20 0537  BP: 129/76 (!) 142/78 130/80 128/81  Pulse: 63 65 78 68  Resp: 20 (!) 22 20 18   Temp: 98.2 F (36.8 C) 98 F (36.7 C) 98.3 F (36.8 C) 98.1  F (36.7 C)  TempSrc:   Oral Oral  SpO2:      Weight:      Height:       General: alert, cooperative, and no distress Lochia: appropriate Uterine Fundus: firm Incision: N/A DVT Evaluation: No evidence of DVT seen on physical exam. Labs: Lab Results  Component Value Date   WBC 7.7 12/13/2020   HGB 10.8 (L) 12/13/2020   HCT 33.0 (L) 12/13/2020   MCV 87.3 12/13/2020   PLT 212 12/13/2020   CMP Latest Ref Rng & Units 12/13/2020  Glucose 70 - 99 mg/dL 75  BUN 6 - 20 mg/dL 8  Creatinine 0.44 - 1.00 mg/dL 0.51  Sodium 135 - 145 mmol/L 134(L)  Potassium 3.5 - 5.1 mmol/L 3.8  Chloride 98 - 111 mmol/L 104  CO2 22 - 32 mmol/L 21(L)  Calcium 8.9 - 10.3 mg/dL 9.0  Total Protein 6.5 - 8.1 g/dL 6.2(L)  Total Bilirubin 0.3 - 1.2 mg/dL 0.3  Alkaline Phos 38 - 126 U/L 168(H)  AST 15 - 41 U/L 18  ALT 0 - 44 U/L 12   Edinburgh Score: Edinburgh Postnatal Depression Scale Screening Tool 12/15/2020  I have been able to laugh and see the funny side of things. 0  I have looked forward with enjoyment to things. 0  I have blamed myself unnecessarily when things went wrong. 0  I have been anxious or worried for no good reason. 0  I have felt scared or  panicky for no good reason. 0  Things have been getting on top of me. 1  I have been so unhappy that I have had difficulty sleeping. 0  I have felt sad or miserable. 0  I have been so unhappy that I have been crying. 0  The thought of harming myself has occurred to me. 0  Edinburgh Postnatal Depression Scale Total 1     After visit meds:  Allergies as of 12/15/2020   No Known Allergies      Medication List     STOP taking these medications    aspirin EC 81 MG tablet   Magnesium 200 MG Tabs       TAKE these medications    acetaminophen 325 MG tablet Commonly known as: TYLENOL Take 325 mg by mouth daily as needed for mild pain.   FOLIC ACID PO Take 1 tablet by mouth daily.   ibuprofen 600 MG tablet Commonly known as:  ADVIL Take 1 tablet (600 mg total) by mouth every 6 (six) hours.   multivitamin-prenatal 27-0.8 MG Tabs tablet Take 1 tablet by mouth daily at 12 noon.   NIFEdipine 30 MG 24 hr tablet Commonly known as: PROCARDIA-XL/NIFEDICAL-XL Take 1 tablet (30 mg total) by mouth daily.   oxyCODONE 5 MG immediate release tablet Commonly known as: Oxy IR/ROXICODONE Take 1 tablet (5 mg total) by mouth every 4 (four) hours as needed for up to 3 days for moderate pain.   polyethylene glycol 17 g packet Commonly known as: MiraLax Take 17 g by mouth daily.   simethicone 80 MG chewable tablet Commonly known as: MYLICON Chew 1 tablet (80 mg total) by mouth as needed for flatulence.         Discharge home in stable condition Infant Feeding: Bottle and Breast Infant Disposition:home with mother Discharge instruction: per After Visit Summary and Postpartum booklet. Activity: Advance as tolerated. Pelvic rest for 6 weeks.  Diet: routine diet Future Appointments: Future Appointments  Date Time Provider [place]  12/29/2020  9:00 AM [facility] NURSE [facility]  01/12/2021 10:35 AM Woodroe Mode, MD [facility]   Follow up Visit:  Myrtis Ser, CNM  P [facility] Admin Pool Please schedule this patient for Postpartum visit in: 4 weeks with the following provider: Any provider  In-Person  For C/S patients schedule nurse incision check in weeks 2 weeks: no  High risk pregnancy complicated by: [name]  Delivery mode:  SVD  Anticipated Birth Control:  [place] [facility] Patch PP Procedures needed: BP check in 1wk  Schedule Integrated [place] visit: no    12/15/2020 Delora Fuel, MD  I personally saw and evaluated the patient, performing the key elements of the service. I developed and verified the management plan that is described in the resident's/student's note, and I agree with the content with my edits above. VSS, HRR&R, Resp unlabored, Legs neg.  Nigel Berthold, CNM 12/18/2020 6:07 PM

## 2020-12-13 NOTE — Progress Notes (Signed)
Patient ID: Connie Boyd, female   DOB: 27-Jul-1985, 35 y.o.   MRN: [id number]  S/p cervical foley and cytotec x 2 doses; feeling more pain; breathing w ctx  FHR 140s, +accels, occ variables Ctx q 4 mins Cx 6/90/vtx -1; ROM w exam for clear fluid  IUP@40 .5wks Active labor [name]- stable  Expectant management IV Fentanyl per her request Anticipate vag del  Arabella Merles [facility] 12/13/2020 9:55 PM

## 2020-12-14 ENCOUNTER — Encounter (HOSPITAL_COMMUNITY): Payer: Self-pay | Admitting: Family Medicine

## 2020-12-14 LAB — RPR: RPR Ser Ql: NONREACTIVE

## 2020-12-14 MED ORDER — ACETAMINOPHEN 325 MG PO TABS
650.0000 mg | ORAL_TABLET | ORAL | Status: DC | PRN
  Administered 2020-12-14 (×2): 650 mg via ORAL
  Filled 2020-12-14 (×2): qty 2

## 2020-12-14 MED ORDER — ONDANSETRON HCL 4 MG PO TABS: 4.0000 mg | ORAL_TABLET | ORAL | Status: DC | PRN

## 2020-12-14 MED ORDER — MEASLES, MUMPS & RUBELLA VAC IJ SOLR: 0.5000 mL | Freq: Once | INTRAMUSCULAR | Status: DC

## 2020-12-14 MED ORDER — SIMETHICONE 80 MG PO CHEW: 80.0000 mg | CHEWABLE_TABLET | ORAL | Status: DC | PRN

## 2020-12-14 MED ORDER — DIBUCAINE (PERIANAL) 1 % EX OINT: 1.0000 "application " | TOPICAL_OINTMENT | CUTANEOUS | Status: DC | PRN

## 2020-12-14 MED ORDER — PRENATAL MULTIVITAMIN CH
1.0000 | ORAL_TABLET | Freq: Every day | ORAL | Status: DC
  Administered 2020-12-14: 1 via ORAL
  Filled 2020-12-14: qty 1

## 2020-12-14 MED ORDER — WITCH HAZEL-GLYCERIN EX PADS: 1.0000 "application " | MEDICATED_PAD | CUTANEOUS | Status: DC | PRN

## 2020-12-14 MED ORDER — IBUPROFEN 600 MG PO TABS
600.0000 mg | ORAL_TABLET | Freq: Four times a day (QID) | ORAL | Status: DC
  Administered 2020-12-14 – 2020-12-15 (×6): 600 mg via ORAL
  Filled 2020-12-14 (×6): qty 1

## 2020-12-14 MED ORDER — TETANUS-DIPHTH-ACELL PERTUSSIS 5-2.5-18.5 LF-MCG/0.5 IM SUSY: 0.5000 mL | PREFILLED_SYRINGE | Freq: Once | INTRAMUSCULAR | Status: DC

## 2020-12-14 MED ORDER — COCONUT OIL OIL: 1.0000 "application " | TOPICAL_OIL | Status: DC | PRN

## 2020-12-14 MED ORDER — OXYCODONE HCL 5 MG PO TABS
5.0000 mg | ORAL_TABLET | ORAL | Status: DC | PRN
  Administered 2020-12-14: 5 mg via ORAL
  Filled 2020-12-14: qty 1

## 2020-12-14 MED ORDER — BENZOCAINE-MENTHOL 20-0.5 % EX AERO: 1.0000 "application " | INHALATION_SPRAY | CUTANEOUS | Status: DC | PRN

## 2020-12-14 MED ORDER — DIPHENHYDRAMINE HCL 25 MG PO CAPS: 25.0000 mg | ORAL_CAPSULE | Freq: Four times a day (QID) | ORAL | Status: DC | PRN

## 2020-12-14 MED ORDER — MEDROXYPROGESTERONE ACETATE 150 MG/ML IM SUSP: 150.0000 mg | INTRAMUSCULAR | Status: DC | PRN

## 2020-12-14 MED ORDER — ONDANSETRON HCL 4 MG/2ML IJ SOLN: 4.0000 mg | INTRAMUSCULAR | Status: DC | PRN

## 2020-12-14 MED ORDER — SENNOSIDES-DOCUSATE SODIUM 8.6-50 MG PO TABS
2.0000 | ORAL_TABLET | Freq: Every day | ORAL | Status: DC
  Administered 2020-12-14: 2 via ORAL
  Filled 2020-12-14: qty 2

## 2020-12-14 NOTE — Plan of Care (Signed)
Pt admitted to the unit.  Oriented to the room and admission package reviewed to include menu, edinburgh, baby safety  and feeding log.  Pt verbalized understanding.

## 2020-12-14 NOTE — Progress Notes (Signed)
Post Partum Day 1 Subjective: up ad lib, voiding, tolerating PO, and + flatus. Normal lochia. Patient states she is having some mild cramping pain in lower abdomen. Pain is manageable on current regiment.   Objective: Blood pressure 127/67, pulse 76, temperature 98.5 F (36.9 C), temperature source Oral, resp. rate 18, height 5\' 3"  (1.6 m), weight 76.9 kg, last menstrual period 02/04/2020, SpO2 100 %, unknown if currently breastfeeding.  Physical Exam:  General: alert, cooperative, and no distress Lochia: appropriate Uterine Fundus: firm Incision: no incision DVT Evaluation: No evidence of DVT seen on physical exam. No cords or calf tenderness. No significant calf/ankle edema.  Recent Labs    12/13/20 1221  HGB 10.8*  HCT 33.0*    Assessment/Plan: -Patient is meeting all postpartum milestones. -pregnancy was complicated by [name], blood pressures have been in normal range postpartum. -discussed contraceptive options with patient, she expressed interest in starting patch at 6w follow-up. She is breastfeeding and will need to be educated on effects of hormonal contraception on milk supply. -Plan for discharge tomorrow, and Breastfeeding.   LOS: 1 day   Semaj Coburn J Rodriguez-Teodoro 12/14/2020, 8:59 AM

## 2020-12-14 NOTE — Lactation Note (Signed)
This note was copied from a baby's chart. Lactation Consultation Note Mom holding sleeping baby. Mom stated baby is latching well. Experienced BF mom BF all of her children 3 months breast/formula feeding.  Newborn feeding habits reviewed. Mom encouraged to feed baby 8-12 times/24 hours and with feeding cues.    Encouraged BF STS no blankets. Asked mom to call for further questions or concerns. Spanish Lactation brochure given. Mom spoke good enough English not to need interpreter for this consult.  Patient Name: Connie Boyd [id number] Date: 12/14/2020 Reason for consult: Initial assessment;Term Age:32 hours  Maternal Data    Feeding Nipple Type: Slow - flow  LATCH Score                    Lactation Tools Discussed/Used    Interventions    Discharge WIC Program: Yes  Consult Status Consult Status: Follow-up Date: 12/14/20 Follow-up type: In-patient    Charyl Dancer 12/14/2020, 5:05 AM

## 2020-12-14 NOTE — Lactation Note (Deleted)
This note was copied from a baby's chart. Lactation Consultation Note  Patient Name: Connie Boyd Date: 12/14/2020 Reason for consult: Initial assessment;Term Age:36 hours  Maternal Data    Feeding Nipple Type: Slow - flow  LATCH Score                    Lactation Tools Discussed/Used    Interventions    Discharge WIC Program: Yes  Consult Status Consult Status: PRN Date: 12/14/20 Follow-up type: In-patient    Regnald Bowens, Diamond Nickel 12/14/2020, 5:09 AM

## 2020-12-14 NOTE — Lactation Note (Signed)
This note was copied from a baby's chart. Lactation Consultation Note  Patient Name: Connie Boyd [id number] Date: 12/14/2020 Reason for consult: L&D Initial assessment;Term Age:36 hours  Mom had baby on the breast when LC came into rm. Baby BF well. Mom's 5th child. Mom stated she only BF all of them for 3 months breast/formula feeding. Mom is breast/formula feeding for this baby as well.  Will f/u w/mom on MBU.  Maternal Data    Feeding    LATCH Score Latch: Grasps breast easily, tongue down, lips flanged, rhythmical sucking.  Audible Swallowing: None  Type of Nipple: Everted at rest and after stimulation  Comfort (Breast/Nipple): Soft / non-tender  Hold (Positioning): No assistance needed to correctly position infant at breast.  LATCH Score: 8   Lactation Tools Discussed/Used    Interventions Interventions: Breast feeding basics reviewed;Skin to skin  Discharge    Consult Status Consult Status: Follow-up from L&D Date: 12/14/20 Follow-up type: In-patient    Connie Boyd, Diamond Nickel 12/14/2020, 12:02 AM

## 2020-12-15 ENCOUNTER — Other Ambulatory Visit (HOSPITAL_COMMUNITY): Payer: Self-pay

## 2020-12-15 ENCOUNTER — Inpatient Hospital Stay (HOSPITAL_COMMUNITY): Payer: Self-pay

## 2020-12-15 LAB — BIRTH TISSUE RECOVERY COLLECTION (PLACENTA DONATION)

## 2020-12-15 MED ORDER — POLYETHYLENE GLYCOL 3350 17 G PO PACK: 17.0000 g | PACK | Freq: Every day | ORAL | 0 refills | Status: DC

## 2020-12-15 MED ORDER — SIMETHICONE 80 MG PO CHEW
80.0000 mg | CHEWABLE_TABLET | ORAL | 0 refills | Status: DC | PRN
Start: 2020-12-15 — End: 2021-08-06

## 2020-12-15 MED ORDER — IBUPROFEN 600 MG PO TABS
600.0000 mg | ORAL_TABLET | Freq: Four times a day (QID) | ORAL | 0 refills | Status: DC
  Filled 2020-12-15: qty 30, 8d supply, fill #0

## 2020-12-15 MED ORDER — OXYCODONE HCL 5 MG PO TABS
5.0000 mg | ORAL_TABLET | ORAL | 0 refills | Status: AC | PRN
  Filled 2020-12-15: qty 15, 3d supply, fill #0

## 2020-12-15 MED ORDER — NIFEDIPINE ER 30 MG PO TB24
30.0000 mg | ORAL_TABLET | Freq: Every day | ORAL | 0 refills | Status: DC
  Filled 2020-12-15: qty 30, 30d supply, fill #0

## 2020-12-25 ENCOUNTER — Telehealth (HOSPITAL_COMMUNITY): Payer: Self-pay | Admitting: *Deleted

## 2020-12-25 NOTE — Telephone Encounter (Signed)
Interpreter left message to return nurse call if any concerns regarding mom or baby.  Duffy Rhody, RN 12-25-2020 at 12:34pm

## 2020-12-29 ENCOUNTER — Ambulatory Visit: Payer: Self-pay

## 2021-01-12 ENCOUNTER — Encounter: Payer: Self-pay | Admitting: Obstetrics & Gynecology

## 2021-01-12 ENCOUNTER — Other Ambulatory Visit: Payer: Self-pay

## 2021-01-12 ENCOUNTER — Ambulatory Visit (INDEPENDENT_AMBULATORY_CARE_PROVIDER_SITE_OTHER): Payer: Self-pay | Admitting: Obstetrics & Gynecology

## 2021-01-12 DIAGNOSIS — Z9189 Other specified personal risk factors, not elsewhere classified: Secondary | ICD-10-CM

## 2021-01-12 MED ORDER — NORETHINDRONE 0.35 MG PO TABS
1.0000 | ORAL_TABLET | Freq: Every day | ORAL | 11 refills | Status: DC
Start: 2021-01-12 — End: 2021-12-26

## 2021-01-12 NOTE — Patient Instructions (Addendum)
Para conducto obstruido:  Compresa caliente durante 15 minutos antes de amamantar Alimente al beb con la nariz apuntando hacia el rea tapada [place] y/o extraer a mano para aliviar el rea obstruida Ibuprofeno 600 mg cada 6 horas durante 48 horas Llame a la oficina al [phone] si no mejora o si tiene fiebre o sntomas similares a los de Emergency planning/management officer.Si es durante el fin de semana puede acudir a Furniture conservator/restorer de [place].   For plugged duct:   Warm compress for 15 minutes prior to breast feeding Feed infant with nose pointed to the plugged area Pump and/or hand express to relieve plugged area Ibuprofen 600 mg every 6 hours for 48 hours Please call the office at [phone] if you are not improving or develop a fever or flu like symptoms. If over the weekend can go to the Maternity Assessment Unit.

## 2021-01-12 NOTE — Progress Notes (Signed)
Spoke with patient while in the office for PP visit with Assistance of Research officer, trade union.   Patient is complaining of pain to right nipple and outer and under side of breast. She is breast feeding her infant. Pain started 2 days ago, prior to that no pain was noted. Breast is firm to from the 6 o'clock to the 10 o'clock area. No milk bleb noted. Reviewed plugged duct treatment with patient, instructions for care explained and put in AVS.   Infant is breast feeding at least every 3 hours, feeding on both breasts with each feeding. Infant is taking 1-1.5 ounces of formula 3-4 times a day via bottle after breast feeding. Mom is not pumping, but has a pump at home Infant is voiding and stooling well. Taught hand expression, mom did very well.   Reviewed importance of emptying the breast with baby or pump to prevent Mastitis or losing milk supply.   Reviewed:   Warm moist compresses to right breast for 15 minutes prior to feeding (hot tap water in diaper) Vibrator or electric toothbrush to area prior to feeding Feed infant with nose pointing to outer aspect of the breast or dangle feeding Pump with pump or hand express to help remove plug Call office or send in My Chart message if not improving or develops flu like symptoms. Reviewed MAU if needed over the weekend  Patient voiced understanding to recommendations.

## 2021-01-12 NOTE — Progress Notes (Signed)
    Post Partum Visit Note  Connie Boyd is a 35 y.o. [phone] female who presents for a postpartum visit. She is 4 weeks postpartum following a normal spontaneous vaginal delivery.  I have fully reviewed the prenatal and intrapartum course. The delivery was at 40.5 gestational weeks.  Anesthesia: none. Postpartum course has been good. Baby is doing well. Baby is feeding by both breast and bottle - Gerber Gentle . Bleeding staining only. Bowel function is normal. Bladder function is normal. Patient is not sexually active. Contraception method is none. Postpartum depression screening: negative.   The pregnancy intention screening data noted above was reviewed. Potential methods of contraception were discussed. The patient elected to proceed with No data recorded.    Health Maintenance Due  Topic Date Due   COVID-19 Vaccine (1) Never done   INFLUENZA VACCINE  11/27/2020    The following portions of the patient's history were reviewed and updated as appropriate: allergies, current medications, past family history, past medical history, past social history, past surgical history, and problem list.  Review of Systems Pertinent items are noted in HPI.  Objective:  LMP 02/04/2020 (Exact Date)    General:  alert, cooperative, and no distress   Breasts:  abnormal right breast tender no mass or redness outer quadrant, noted for 2 days  Lungs: clear to auscultation bilaterally  Heart:  regular rate and rhythm  Abdomen: soft, non-tender; bowel sounds normal; no masses,  no organomegaly      GU exam:  not indicated       Assessment:    There are no diagnoses linked to this encounter.  Normal  postpartum exam. Possible blocked milk duct, no sign of mastitis  Plan:   Essential components of care per ACOG recommendations:  1.  Mood and well being: Patient with negative depression screening today. Reviewed local resources for support.  - Patient tobacco use? No.   - hx of drug  use? No.    2. Infant care and feeding:  -Patient currently breastmilk feeding? Yes. Discussed returning to work and pumping. Reviewed importance of draining breast regularly to support lactation.  -Social determinants of health (SDOH) reviewed in EPIC. No concernsT 3. Sexuality, contraception and birth spacing - Patient does not want a pregnancy in the next year.  Desired family size is 5 children.  - Reviewed forms of contraception in tiered fashion. Patient desired oral progesterone-only contraceptive today.   - Discussed birth spacing of 18 months  4. Sleep and fatigue -Encouraged family/partner/community support of 4 hrs of uninterrupted sleep to help with mood and fatigue  5. Physical Recovery  - Discussed patients delivery and complications. She describes her labor as good. - Patient had a Vaginal, no problems at delivery. Patient had a  no  laceration. Perineal healing reviewed. Patient expressed understanding - Patient has urinary incontinence? No. - Patient is safe to resume physical and sexual activity  6.  Health Maintenance - HM due items addressed Yes - Last pap smear  Diagnosis  Date Value Ref Range Status  06/01/2018   Final   NEGATIVE FOR INTRAEPITHELIAL LESIONS OR MALIGNANCY.   Pap smear not done at today's visit.  -Breast Cancer screening indicated? No.   7. Chronic Disease/Pregnancy Condition follow up: None  - PCP follow up  Adam Phenix, MD  Center for [facility], [facility]

## 2021-08-02 ENCOUNTER — Encounter: Payer: Self-pay | Admitting: Obstetrics & Gynecology

## 2021-08-06 ENCOUNTER — Encounter: Payer: Self-pay | Admitting: Obstetrics & Gynecology

## 2021-08-06 ENCOUNTER — Ambulatory Visit (INDEPENDENT_AMBULATORY_CARE_PROVIDER_SITE_OTHER): Payer: Self-pay | Admitting: Obstetrics & Gynecology

## 2021-08-06 ENCOUNTER — Other Ambulatory Visit: Payer: Self-pay

## 2021-08-06 VITALS — BP 114/72 | HR 79

## 2021-08-06 DIAGNOSIS — Z8049 Family history of malignant neoplasm of other genital organs: Secondary | ICD-10-CM

## 2021-08-06 DIAGNOSIS — R102 Pelvic and perineal pain: Secondary | ICD-10-CM

## 2021-08-06 DIAGNOSIS — N921 Excessive and frequent menstruation with irregular cycle: Secondary | ICD-10-CM

## 2021-08-06 DIAGNOSIS — G8929 Other chronic pain: Secondary | ICD-10-CM

## 2021-08-06 NOTE — Progress Notes (Signed)
? ? ?Connie Boyd 1985-08-09 [id number] ? ? ?     36 y.o.  [id number] Married ? ?RP: Midline intermittent pelvic pain x 8 months and cervical abnormality on the cervix per Fam MD ? ?HPI: C/O midline intermittent pelvic pain x 8 months, since the delivery of her last child.  Pelvic US 2 weeks ago showed a normal Uterus.  Ovaries not seen, no adnexal mass.  Seen by her fam MD who felt that her cervix was abnormal with an inflamed lesion.  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  Pap 07/10/2021 Negative.  STI screen done, will check results with Fam MD tomorrow.  Last delivery 8 months ago.  BF x 4 months.  Back on the Progestin pill x 4 months. Mild BTB on the Progestin pill. LMP 07/13/2021. ? ? ?OB History  ?Gravida Para Term Preterm AB Living  ?6 5 5  0 1 5  ?SAB IAB Ectopic Multiple Live Births  ?1 0 0 0 5  ?  ?# Outcome Date GA Lbr Len/2nd Weight Sex Delivery Anes PTL Lv  ?6 Term 12/13/20 [redacted]w[redacted]d 15:04 / 00:08 8 lb 6.4 oz (3.81 kg) F Vag-Spont None  LIV  ?5 Term 11/09/18 [redacted]w[redacted]d 05:21 / 00:17 7 lb 14.6 oz (3.59 kg) F Vag-Spont None  LIV  ?4 Term 02/03/12 [redacted]w[redacted]d 03:05 / 00:13 6 lb 1 oz (2.75 kg) M Vag-Spont None  LIV  ?3 Term 02/22/08 [redacted]w[redacted]d   F Vag-Spont None N LIV  ?2 Term 03/03/05 [redacted]w[redacted]d   M Vag-Spont EPI N LIV  ?1 SAB           ? ? ?Past medical history,surgical history, problem list, medications, allergies, family history and social history were all reviewed and documented in the EPIC chart. ? ? ?Directed ROS with pertinent positives and negatives documented in the history of present illness/assessment and plan. ? ?Exam: ? ?Vitals:  ? 08/06/21 1001  ?BP: 114/72  ?Pulse: 79  ?SpO2: 99%  ? ?General appearance:  Normal ? ?Abdomen: Normal ? ?Gynecologic exam: Vulva normal.  Speculum:  Cervix s/p Cryotherapy, Endocervical canal visible anteriorly, probably corresponding to the "inflamed cervix" seen by her Fam MD.  Vagina normal.  Normal secretions.  No bleeding.  Bimanual exam:  Uterus AV, normal  volume, mobile, NT.  No adnexal mass, NT. ? ? ? ?Assessment/Plan:  36 y.o. 30  ? ?1. Chronic intermittent pelvic pain in female ?C/O midline intermittent pelvic pain x 8 months, since the delivery of her last child.  Pelvic [id number] 2 weeks ago showed a normal Uterus.  Ovaries not seen, no adnexal mass.  Seen by her fam MD who felt that her cervix was abnormal with an inflamed lesion.  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  Pap 07/10/2021 Negative.  STI screen done, will check results with Fam MD tomorrow.  Last delivery 8 months ago.  BF x 4 months.  Back on the Progestin pill x 4 months.  Mild BTB on the Progestin pill. LMP 07/13/2021.   ?Gynecologic exam normal today.  Will repeat a Pelvic 07/15/2021 here given that her ovaries were not identified on the previous pelvic US. ?- US Transvaginal Non-OB; Future ? ?2. Breakthrough bleeding on birth control pills ?Mild BTB on the Progestin pill.  Will investigate further with a Pelvic US. ?- US Transvaginal Non-OB; Future ? ?3. Family history of cancer of female genital organ ?R/O Ovarian and Uterine pathology per Pelvic US at f/u. ?- US  Transvaginal Non-OB; Future  ? ?Counseling on pelvic pain and breakthrough bleeding on the progestin pill as well as her history of cryotherapy of the cervix and family history of female genital organ cancer, documentation reviewed thoroughly and management discussed for 45 minutes. ? ?Genia Del MD, 10:31 AM 08/06/2021 ? ? ? ?  ?

## 2021-08-07 ENCOUNTER — Encounter: Payer: Self-pay | Admitting: Obstetrics & Gynecology

## 2021-08-10 ENCOUNTER — Other Ambulatory Visit: Payer: Self-pay

## 2021-08-10 DIAGNOSIS — Z8049 Family history of malignant neoplasm of other genital organs: Secondary | ICD-10-CM

## 2021-09-04 ENCOUNTER — Ambulatory Visit (INDEPENDENT_AMBULATORY_CARE_PROVIDER_SITE_OTHER): Payer: Self-pay

## 2021-09-04 ENCOUNTER — Encounter: Payer: Self-pay | Admitting: Obstetrics & Gynecology

## 2021-09-04 ENCOUNTER — Ambulatory Visit (INDEPENDENT_AMBULATORY_CARE_PROVIDER_SITE_OTHER): Payer: Self-pay | Admitting: Obstetrics & Gynecology

## 2021-09-04 VITALS — BP 118/70

## 2021-09-04 DIAGNOSIS — R102 Pelvic and perineal pain: Secondary | ICD-10-CM

## 2021-09-04 DIAGNOSIS — N921 Excessive and frequent menstruation with irregular cycle: Secondary | ICD-10-CM

## 2021-09-04 DIAGNOSIS — Z8049 Family history of malignant neoplasm of other genital organs: Secondary | ICD-10-CM

## 2021-09-04 DIAGNOSIS — G8929 Other chronic pain: Secondary | ICD-10-CM

## 2021-09-04 NOTE — Progress Notes (Signed)
? ? ?Connie Boyd [birth year] [id number] ? ? ?     36 y.o.  [id number]  ? ?RP: Midline intermittent lower abdominal pain for Pelvic US ? ?HPI: Last visit on 08/06/21 we noted:  C/O midline intermittent pelvic pain x 8 months, since the delivery of her last child.  Pelvic US 2 weeks ago showed a normal Uterus.  Ovaries not seen, no adnexal mass.  Seen by her fam MD who felt that her cervix was abnormal with an inflamed lesion.  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  Pap 07/10/2021 Negative.  STI screen Neg 06/2021.   ? ?Fam h/o Ovarian and Uterine Cancer. ? ?Gyn exam on 07/28/2021: Vulva normal.  Speculum:  Cervix s/p Cryotherapy, Endocervical canal visible anteriorly, probably corresponding to the "inflamed cervix" seen by her Fam MD.  Vagina normal.  Normal secretions.  No bleeding.  Bimanual exam:  Uterus AV, normal volume, mobile, NT.  No adnexal mass, NT. ? ?Last delivery 9 months ago.  BF x 4 months.  Back on the Progestin pill x 5 months. Mild BTB on the Progestin pill. ? ? ?OB History  ?Gravida Para Term Preterm AB Living  ?6 5 5  0 1 5  ?SAB IAB Ectopic Multiple Live Births  ?1 0 0 0 5  ?  ?# Outcome Date GA Lbr Len/2nd Weight Sex Delivery Anes PTL Lv  ?6 Term 12/13/20 [redacted]w[redacted]d 15:04 / 00:08 8 lb 6.4 oz (3.81 kg) F Vag-Spont None  LIV  ?5 Term 11/09/18 [redacted]w[redacted]d 05:21 / 00:17 7 lb 14.6 oz (3.59 kg) F Vag-Spont None  LIV  ?4 Term 02/03/12 [redacted]w[redacted]d 03:05 / 00:13 6 lb 1 oz (2.75 kg) M Vag-Spont None  LIV  ?3 Term 02/22/08 [redacted]w[redacted]d   F Vag-Spont None N LIV  ?2 Term 03/03/05 [redacted]w[redacted]d   M Vag-Spont EPI N LIV  ?1 SAB           ? ? ?Past medical history,surgical history, problem list, medications, allergies, family history and social history were all reviewed and documented in the EPIC chart. ? ? ?Directed ROS with pertinent positives and negatives documented in the history of present illness/assessment and plan. ? ?Exam: ? ?There were no vitals filed for this visit. ?General appearance:  Normal ? ?Pelvic [redacted]w[redacted]d  today: T/V images.  Anteverted uterus normal in size and shape with no myometrial mass.  The uterus is measured at 8.68 x 5.75 x 4.59 cm.  Sec. endometrial lining measured at 10.2 mm with no obvious mass or abnormal blood flow seen.  The right ovary is slightly atrophic in appearance with positive perfusion.  The left ovary is slightly enlarged with multi follicular pattern.  Otherwise normal in appearance.  Positive perfusion to the left ovary.  No adnexal mass.  Trace free fluid in the pelvis. ? ? ?Assessment/Plan:  36 y.o. 30  ? ?1. Chronic intermittent pelvic pain in female ?Last visit on 08/06/21 we noted:  C/O midline intermittent pelvic pain x 8 months, since the delivery of her last child.  Pelvic 10/06/21 2 weeks ago showed a normal Uterus.  Ovaries not seen, no adnexal mass.  Seen by her fam MD who felt that her cervix was abnormal with an inflamed lesion.  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  Pap 07/10/2021 Negative.  STI screen Neg 06/2021.   ? ?Fam h/o Ovarian and Uterine Cancer. ? ?Gyn exam on 07/28/2021: Vulva normal.  Speculum:  Cervix s/p Cryotherapy, Endocervical canal  visible anteriorly, probably corresponding to the "inflamed cervix" seen by her Fam MD.  Vagina normal.  Normal secretions.  No bleeding.  Bimanual exam:  Uterus AV, normal volume, mobile, NT.  No adnexal mass, NT. ? ?Last delivery 9 months ago.  BF x 4 months.  Back on the Progestin pill x 5 months. Mild BTB on the Progestin pill. ? ?Pelvic US today thoroughly reviewed.  Normal uterus and endometrial line which is secretory measured at 10.2 mm.  Ovaries wnl with a multifollicular pattern on the left ovary.  No adnexal mass.  Trace FF.  Decision to observe.  Lower abdominal discomfort might be intestinal.  Recommendations to try different foods and increase physical activities. ? ?2. Breakthrough bleeding on birth control pills ?Well on the Progestin pill.  Very light BTB.  Endometrial line normal on Pelvic US  today. ? ?3. Family history of cancer of female genital organ  ? ?Genia Del MD, 4:19 PM 09/04/2021 ? ? ? ?  ?

## 2021-12-26 ENCOUNTER — Ambulatory Visit (INDEPENDENT_AMBULATORY_CARE_PROVIDER_SITE_OTHER): Payer: Self-pay | Admitting: Obstetrics & Gynecology

## 2021-12-26 ENCOUNTER — Encounter: Payer: Self-pay | Admitting: Obstetrics & Gynecology

## 2021-12-26 VITALS — BP 120/78 | HR 63 | Ht 58.5 in | Wt 151.0 lb

## 2021-12-26 DIAGNOSIS — Z6831 Body mass index (BMI) 31.0-31.9, adult: Secondary | ICD-10-CM

## 2021-12-26 DIAGNOSIS — E6609 Other obesity due to excess calories: Secondary | ICD-10-CM

## 2021-12-26 DIAGNOSIS — Z3041 Encounter for surveillance of contraceptive pills: Secondary | ICD-10-CM

## 2021-12-26 DIAGNOSIS — Z01419 Encounter for gynecological examination (general) (routine) without abnormal findings: Secondary | ICD-10-CM

## 2021-12-26 MED ORDER — NORETHINDRONE 0.35 MG PO TABS: 1.0000 | ORAL_TABLET | Freq: Every day | ORAL | 4 refills | Status: DC

## 2021-12-26 NOTE — Progress Notes (Signed)
ROMY IPOCK [facility] February 05, 1986 [id number]   History:    36 y.o. [id number] married.  Children from 1 yo to 70 yo.  RP:  Established patient presenting for annual gyn exam   HPI: Well on the Progestin only pill.  Light menses, no longer having BTB.  Pelvic US 08/2021 Negative.  No pelvic pain anymore.  No pain with IC.   Pap 07/10/2021 Negative.  H/O Cryotherapy around 2016-2017.  STI screen Neg 06/2021.  Breasts normal.  BMI 31.02.  Walking more with her husband.  Lower calorie/carb diet.  Health labs with [facility].  Past medical history,surgical history, family history and social history were all reviewed and documented in the EPIC chart.  Gynecologic History Patient's last menstrual period was 12/15/2021 (exact date).  Obstetric History OB History  Gravida Para Term Preterm AB Living  6 5 5  0 1 5  SAB IAB Ectopic Multiple Live Births  1 0 0 0 5    # Outcome Date GA Lbr Len/2nd Weight Sex Delivery Anes PTL Lv  6 Term 12/13/20 [redacted]w[redacted]d 15:04 / 00:08 8 lb 6.4 oz (3.81 kg) F Vag-Spont None  LIV  5 Term 11/09/18 [redacted]w[redacted]d 05:21 / 00:17 7 lb 14.6 oz (3.59 kg) F Vag-Spont None  LIV  4 Term 02/03/12 [redacted]w[redacted]d 03:05 / 00:13 6 lb 1 oz (2.75 kg) M Vag-Spont None  LIV  3 Term 02/22/08 [redacted]w[redacted]d   F Vag-Spont None N LIV  2 Term 03/03/05 [redacted]w[redacted]d   M Vag-Spont EPI N LIV  1 SAB              ROS: A ROS was performed and pertinent positives and negatives are included in the history. GENERAL: No fevers or chills. HEENT: No change in vision, no earache, sore throat or sinus congestion. NECK: No pain or stiffness. CARDIOVASCULAR: No chest pain or pressure. No palpitations. PULMONARY: No shortness of breath, cough or wheeze. GASTROINTESTINAL: No abdominal pain, nausea, vomiting or diarrhea, melena or bright red blood per rectum. GENITOURINARY: No urinary frequency, urgency, hesitancy or dysuria. MUSCULOSKELETAL: No joint or muscle pain, no back pain, no recent trauma. DERMATOLOGIC: No rash, no itching, no lesions.  ENDOCRINE: No polyuria, polydipsia, no heat or cold intolerance. No recent change in weight. HEMATOLOGICAL: No anemia or easy bruising or bleeding. NEUROLOGIC: No headache, seizures, numbness, tingling or weakness. PSYCHIATRIC: No depression, no loss of interest in normal activity or change in sleep pattern.     Exam:   BP 120/78   Pulse 63   Ht 4' 10.5" (1.486 m)   Wt 151 lb (68.5 kg)   LMP 12/15/2021 (Exact Date)   SpO2 99%   BMI 31.02 kg/m   Body mass index is 31.02 kg/m.  General appearance : Well developed well nourished female. No acute distress HEENT: Eyes: no retinal hemorrhage or exudates,  Neck supple, trachea midline, no carotid bruits, no thyroidmegaly Lungs: Clear to auscultation, no rhonchi or wheezes, or rib retractions  Heart: Regular rate and rhythm, no murmurs or gallops Breast:Examined in sitting and supine position were symmetrical in appearance, no palpable masses or tenderness,  no skin retraction, no nipple inversion, no nipple discharge, no skin discoloration, no axillary or supraclavicular lymphadenopathy Abdomen: no palpable masses or tenderness, no rebound or guarding Extremities: no edema or skin discoloration or tenderness  Pelvic: Vulva: Normal             Vagina: No gross lesions or discharge  Cervix: No gross lesions or discharge  Uterus  AV, normal size, shape and consistency, non-tender and mobile  Adnexa  Without masses or tenderness  Anus: Normal   Assessment/Plan:  36 y.o. female for annual exam   1. Well female exam with routine gynecological exam Well on the Progestin only pill.  Light menses, no longer having BTB.  Pelvic US 08/2021 Negative.  No pelvic pain anymore.  No pain with IC.   Pap 07/10/2021 Negative.  H/O Cryotherapy around 2016-2017.  STI screen Neg 06/2021.  Breasts normal.  BMI 31.02.  Walking more with her husband.  Lower calorie/carb diet.  Health labs with [facility].  2. Encounter for surveillance of contraceptive pills Well  on the Progestin only pill.  No CI to continue.  Prescription sent to pharmacy. - norethindrone (MICRONOR) 0.35 MG tablet; Take 1 tablet (0.35 mg total) by mouth daily.  3. Class 1 obesity due to excess calories without serious comorbidity with body mass index (BMI) of 31.0 to 31.9 in adult  Other orders - Cyanocobalamin (VITAMIN B-12 PO); Take by mouth. - VITAMIN E PO; Take by mouth.   Genia Del MD, 8:37 AM 12/26/2021

## 2023-01-02 ENCOUNTER — Ambulatory Visit: Payer: Self-pay | Admitting: Obstetrics & Gynecology

## 2023-01-09 ENCOUNTER — Ambulatory Visit: Payer: Self-pay | Admitting: Radiology

## 2024-01-09 ENCOUNTER — Inpatient Hospital Stay (HOSPITAL_COMMUNITY): Payer: Self-pay

## 2024-01-09 ENCOUNTER — Inpatient Hospital Stay (HOSPITAL_COMMUNITY)
Admission: AD | Admit: 2024-01-09 | Discharge: 2024-01-09 | Disposition: A | Discharge status: Home / Self Care | Payer: Self-pay | Attending: Obstetrics and Gynecology | Admitting: Obstetrics and Gynecology

## 2024-01-09 ENCOUNTER — Other Ambulatory Visit: Payer: Self-pay

## 2024-01-09 ENCOUNTER — Encounter (HOSPITAL_COMMUNITY): Payer: Self-pay | Admitting: Obstetrics and Gynecology

## 2024-01-09 DIAGNOSIS — O26891 Other specified pregnancy related conditions, first trimester: Secondary | ICD-10-CM | POA: Insufficient documentation

## 2024-01-09 DIAGNOSIS — O3680X0 Pregnancy with inconclusive fetal viability, not applicable or unspecified: Secondary | ICD-10-CM | POA: Insufficient documentation

## 2024-01-09 DIAGNOSIS — R1030 Lower abdominal pain, unspecified: Secondary | ICD-10-CM | POA: Insufficient documentation

## 2024-01-09 DIAGNOSIS — N898 Other specified noninflammatory disorders of vagina: Secondary | ICD-10-CM | POA: Insufficient documentation

## 2024-01-09 DIAGNOSIS — Z3A01 Less than 8 weeks gestation of pregnancy: Secondary | ICD-10-CM | POA: Insufficient documentation

## 2024-01-09 DIAGNOSIS — Z113 Encounter for screening for infections with a predominantly sexual mode of transmission: Secondary | ICD-10-CM | POA: Insufficient documentation

## 2024-01-09 LAB — URINALYSIS, ROUTINE W REFLEX MICROSCOPIC
Bilirubin Urine: NEGATIVE
Glucose, UA: NEGATIVE mg/dL
Ketones, ur: NEGATIVE mg/dL
Leukocytes,Ua: NEGATIVE
Nitrite: NEGATIVE
Protein, ur: NEGATIVE mg/dL
Specific Gravity, Urine: 1.003 — ABNORMAL LOW (ref 1.005–1.030)
pH: 6 (ref 5.0–8.0)

## 2024-01-09 LAB — CBC
HCT: 35.9 % — ABNORMAL LOW (ref 36.0–46.0)
Hemoglobin: 12.8 g/dL (ref 12.0–15.0)
MCH: 31.5 pg (ref 26.0–34.0)
MCHC: 35.7 g/dL (ref 30.0–36.0)
MCV: 88.4 fL (ref 80.0–100.0)
Platelets: 235 K/uL (ref 150–400)
RBC: 4.06 MIL/uL (ref 3.87–5.11)
RDW: 11.7 % (ref 11.5–15.5)
WBC: 4.9 K/uL (ref 4.0–10.5)
nRBC: 0 % (ref 0.0–0.2)

## 2024-01-09 LAB — WET PREP, GENITAL
Clue Cells Wet Prep HPF POC: NONE SEEN
Sperm: NONE SEEN
Trich, Wet Prep: NONE SEEN
WBC, Wet Prep HPF POC: <10 (ref <10)
Yeast Wet Prep HPF POC: NONE SEEN

## 2024-01-09 LAB — POCT PREGNANCY, URINE: Preg Test, Ur: POSITIVE — AB

## 2024-01-09 LAB — HCG, QUANTITATIVE, PREGNANCY: hCG, Beta Chain, Quant, S: 1911 m[IU]/mL — ABNORMAL HIGH (ref <5)

## 2024-01-09 NOTE — Discharge Instructions (Addendum)
 Connie Boyd,  Hoy acudi a la [facility] ([facility]) por flujo vaginal y Engineer, mining abdominal/de espalda. Ayer tambin tuvo fiebre, pero hoy no; no creo que esto est relacionado con sus otros sntomas. Le hicimos anlisis para detectar una infeccin y no observamos ningn signo. Algunos de esos anlisis (la prueba de gonorrea/clamidia) an no han recibido los [place]; nos pondremos en contacto con usted si los resultados son anormales.  Tambin le hicimos una ecografa para ver si el feto estaba en el tero. Los resultados de [place] de imagen no son claros. Por lo tanto, le rogamos que regrese a la UAM el domingo 14/9 para que podamos revisar sus anlisis; ven a las 9:30 de la maana o ms tarde.  Gracias por permitirme ser parte de su atencin!  Dra. [facility] [place], 1.er ao de posgrado [facility] y [facility] [address], [address], [place] [address]  ..................................................................................................Connie Boyd   Biagio JONETTA Connie Boyd,  You came to the MAU (Maternity Assessment Unit) today for vaginal discharge and abdominal/back pain. You also had a fever yesterday, but not today; I do not think this is related to your other symptoms. We did some labs to look for infection, and did not see any sign of infection. Some of those labs (the gonorrhea/chlamydia test) have not come back yet; we will reach out if results are abnormal.   We also did an ultrasound to see whether the fetus was in the uterus. The results of this imaging study are unclear. Because of this, please come back to the MAU on Sunday 9/14 so that we can recheck your labs; come at 9:30am or later.  Thank you for allowing me to be a part of your care! Alan Flies, MD [facility], PGY1 [facility] at [facility] [address] [place], [place] [address]

## 2024-01-09 NOTE — MAU Provider Note (Addendum)
 Chief Complaint:  Abdominal Pain, Back Pain, and Vaginal Discharge   HPI   Connie Boyd is a 38 y.o. [id number] at Unknown presenting to maternity admissions for brown vaginal discharge, abdominal and back pain, and fever. A Spanish in-person interpreter was used for the entirety of the visit.  She reports starting with lower abdominal pain and back pain 2 days ago; the abdominal pain comes and goes, the back pain is fairly constant. No N/V/D. Does report brown vaginal discharge with a foul odor since 2 days ago. Reports fever last night 9/11 up to 104F. Did not check for fever this morning. No body aches other than previously reported pain. Some headache. They have not had IUP confirmed with ultrasound.  Pregnancy Course: Has not yet established for prenatal care. 5 prior term SVG.  Past Medical History:  Diagnosis Date   Pregnancy induced hypertension    OB History  Gravida Para Term Preterm AB Living  7 5 5  0 1 5  SAB IAB Ectopic Multiple Live Births  1 0 0 0 5    # Outcome Date GA Lbr Len/2nd Weight Sex Type Anes PTL Lv  7 Current           6 Term 12/13/20 [redacted]w[redacted]d 15:04 / 00:08 3810 g F Vag-Spont None  LIV  5 Term 11/09/18 [redacted]w[redacted]d 05:21 / 00:17 3590 g F Vag-Spont None  LIV  4 Term 02/03/12 [redacted]w[redacted]d 03:05 / 00:13 2750 g M Vag-Spont None  LIV  3 Term 02/22/08 [redacted]w[redacted]d   F Vag-Spont None N LIV  2 Term 03/03/05 [redacted]w[redacted]d   M Vag-Spont EPI N LIV  1 SAB            Past Surgical History:  Procedure Laterality Date   APPENDECTOMY     OVARIAN CYST REMOVAL Right    Family History  Problem Relation Age of Onset   Cancer Mother        Uterine, Ovarian, & colon   Cancer Maternal Aunt        Stomach   Cancer Paternal Uncle    Diabetes Maternal Grandmother    Diabetes Paternal Grandfather    Anesthesia problems Neg Hx    Other Neg Hx    Social History   Tobacco Use   Smoking status: Never   Smokeless tobacco: Never  Vaping Use   Vaping status: Never Used  Substance Use Topics    Alcohol use: Yes    Comment: occ   Drug use: Never   No Known Allergies No medications prior to admission.    I have reviewed patient's Past Medical Hx, Surgical Hx, Family Hx, Social Hx, medications and allergies.   ROS  Pertinent items noted in HPI and remainder of comprehensive ROS otherwise negative.   PHYSICAL EXAM  Patient Vitals for the past 24 hrs:  BP Temp Temp src Pulse Resp SpO2  01/09/24 0803 139/81 98.5 F (36.9 C) Oral 76 18 100 %    Constitutional: Well-developed, well-nourished female in no acute distress.  HEENT: atraumatic, normocephalic. Neck has normal ROM. EOM intact. Clear oropharynx. MMM. No erythema, exudate of throat. Nares patent bilaterally. Neck supple. Cardiovascular: normal rate & rhythm, warm and well-perfused Respiratory: normal effort, no problems with respiration noted GI: Abd soft, non-distended. Bilateral lower tenderness across abdomen, no rebound, no guarding. MSK: Extremities nontender, no edema, normal ROM Skin: warm and dry. Acyanotic, no jaundice or pallor. Neurologic: Alert and oriented x 4. No abnormal coordination. Psychiatric: Normal mood. Speech not  slurred, not rapid/pressured. Patient is cooperative. Back: no CVA tenderness. Pain to palpation across entirety of lower back.  Labs: Results for orders placed or performed during the hospital encounter of 01/09/24 (from the past 24 hours)  Pregnancy, urine POC     Status: Abnormal   Collection Time: 01/09/24  8:29 AM  Result Value Ref Range   Preg Test, Ur POSITIVE (A) NEGATIVE  Urinalysis, Routine w reflex microscopic -Urine, Clean Catch     Status: Abnormal   Collection Time: 01/09/24  8:35 AM  Result Value Ref Range   Color, Urine STRAW (A) YELLOW   APPearance HAZY (A) CLEAR   Specific Gravity, Urine 1.003 (L) 1.005 - 1.030   pH 6.0 5.0 - 8.0   Glucose, UA NEGATIVE NEGATIVE mg/dL   Hgb urine dipstick SMALL (A) NEGATIVE   Bilirubin Urine NEGATIVE NEGATIVE   Ketones, ur  NEGATIVE NEGATIVE mg/dL   Protein, ur NEGATIVE NEGATIVE mg/dL   Nitrite NEGATIVE NEGATIVE   Leukocytes,Ua NEGATIVE NEGATIVE   RBC / HPF 0-5 0 - 5 RBC/hpf   WBC, UA 0-5 0 - 5 WBC/hpf   Bacteria, UA RARE (A) NONE SEEN   Squamous Epithelial / HPF 0-5 0 - 5 /HPF  Wet prep, genital     Status: None   Collection Time: 01/09/24  8:37 AM   Specimen: PATH Cytology Cervicovaginal Ancillary Only  Result Value Ref Range   Yeast Wet Prep HPF POC NONE SEEN NONE SEEN   Trich, Wet Prep NONE SEEN NONE SEEN   Clue Cells Wet Prep HPF POC NONE SEEN NONE SEEN   WBC, Wet Prep HPF POC <10 <10   Sperm NONE SEEN   CBC     Status: Abnormal   Collection Time: 01/09/24  8:52 AM  Result Value Ref Range   WBC 4.9 4.0 - 10.5 K/uL   RBC 4.06 3.87 - 5.11 MIL/uL   Hemoglobin 12.8 12.0 - 15.0 g/dL   HCT 64.0 (L) 63.9 - 53.9 %   MCV 88.4 80.0 - 100.0 fL   MCH 31.5 26.0 - 34.0 pg   MCHC 35.7 30.0 - 36.0 g/dL   RDW 88.2 88.4 - 84.4 %   Platelets 235 150 - 400 K/uL   nRBC 0.0 0.0 - 0.2 %  hCG, quantitative, pregnancy     Status: Abnormal   Collection Time: 01/09/24  8:52 AM  Result Value Ref Range   hCG, Beta Chain, Quant, S 1,911 (H) <5 mIU/mL    Imaging:  US  OB LESS THAN 14 WEEKS WITH OB TRANSVAGINAL Result Date: 01/09/2024 CLINICAL DATA:  [id number] Vaginal bleeding in pregnancy, first trimester [id number] EXAM: OBSTETRIC <14 WK US  AND TRANSVAGINAL OB US  TECHNIQUE: Both transabdominal and transvaginal ultrasound examinations were performed for complete evaluation of the gestation as well as the maternal uterus, adnexal regions, and pelvic cul-de-sac. Transvaginal technique was performed to assess early pregnancy. COMPARISON:  09/04/2021 FINDINGS: Intrauterine gestational sac: Not present, at this time. Yolk sac:  Not present, at this time. Fetal Pole:  Not present, at this time. Subchorionic hemorrhage:  None visualized. Maternal uterus/adnexae: Nonenlarged uterus. There is fluid and nodular, hyperechogenic material  filling the endometrial canal and splaying the otherwise normal endometrial stripe. Corpus luteum versus dominant follicle in the right ovary measuring 1.4 cm. No free pelvic fluid. IMPRESSION: No intrauterine pregnancy. Fluid and nodular, hyperechogenic material filling the endometrial canal, splaying the otherwise normal endometrial stripe, which may reflect a combination of fluid and blood products or a collapsed gestational  sac related to a miscarriage in progress given the history of vaginal bleeding. An unseen ectopic pregnancy remains in the differential and serial beta hCG and close sonographic follow-up recommended. Electronically Signed   By: Rogelia Myers M.D.   On: 01/09/2024 10:33     MDM & MAU COURSE  MDM: High  MAU Course: Differential diagnosis considered for 1st trimester vaginal bleeding/discharge includes but is not limited to: ectopic pregnancy, complete spontaneous abortion, incomplete abortion, missed abortion, threatened abortion, embryonic/fetal demise, cervical insufficiency, cervical or vaginal disorder US  OB with questionable gestational sac, concerning in shape.SABRA CBC without concerning findings; white count not elevated. hCG of 1911. Wet prep negative. GC/Chlamydia pending at time of discharge. Low concern for cervical/vaginal infection at this time.  Given US  findings, cannot definitively rule in or out IUP vs ectopic pregnancy at this time. Will have patient return to MAU (will be on Sunday 9/14) after 48 hours from prior hCG to evaluate hCG level.  Differential diagnosis considered for abdominal pain includes but is not limited to: UTI, pyelonephritis, PID, cervicitis, placental abruption, chorioamnionitis, appendicitis. Hx of appendectomy. Afebrile today.  Low concern for infection at this time. Continued follow-up/management as above.  Orders Placed This Encounter  Procedures   Wet prep, genital   US  OB LESS THAN 14 WEEKS WITH OB TRANSVAGINAL   Urinalysis,  Routine w reflex microscopic -Urine, Clean Catch   CBC   hCG, quantitative, pregnancy   Pregnancy, urine POC   Discharge patient    ASSESSMENT   1. Vaginal discharge during pregnancy in first trimester   2. Abdominal pain during pregnancy in first trimester   3. [redacted] weeks gestation of pregnancy     PLAN  Discharge home in stable condition with return precautions. Patient to return to MAU after 48 hours (on Sunday 9/14) for repeat hCG test.  Encouraged to establish with prenatal care; list of providers provided in discharge instructions. GC/Chlamydia pending at time of discharge, will reach out to patient if results are abnormal.     Allergies as of 01/09/2024   No Known Allergies      Medication List     STOP taking these medications    norethindrone  0.35 MG tablet Commonly known as: MICRONOR        TAKE these medications    VITAMIN B-12 PO Take by mouth.   VITAMIN E PO Take by mouth.        Alan Flies, MD   Midwife Attestation:  I personally saw and evaluated the patient, performing the key elements of the service. I developed and verified the management plan that is described in the resident's/student's note, and I agree with the content with my edits above. VSS, HRR&R, Resp unlabored, Legs neg.    Olam Boards, CNM 1:00 PM

## 2024-01-09 NOTE — MAU Note (Signed)
 Connie Boyd is a 38 y.o. at Unknown here in MAU reporting: she had a +HPT a few weeks ago but she's unsure if she's still pregnant secondary lower abdominal and back pain.  States the abdominal pain is intermittent and throbbing, the back pain is a constant dull pressure.  Reports she also has a brown vaginal discharge that has a bad smell.  LMP: 11/22/2023 Onset of complaint: 2 days ago Pain score: 8 There were no vitals filed for this visit.   FHT: NA  Lab orders placed from triage: UPT

## 2024-01-11 ENCOUNTER — Inpatient Hospital Stay (HOSPITAL_COMMUNITY)
Admission: AD | Admit: 2024-01-11 | Discharge: 2024-01-11 | Disposition: A | Discharge status: Home / Self Care | Payer: Self-pay | Attending: Obstetrics and Gynecology | Admitting: Obstetrics and Gynecology

## 2024-01-11 ENCOUNTER — Inpatient Hospital Stay (HOSPITAL_COMMUNITY): Payer: Self-pay

## 2024-01-11 ENCOUNTER — Other Ambulatory Visit: Payer: Self-pay

## 2024-01-11 DIAGNOSIS — O209 Hemorrhage in early pregnancy, unspecified: Secondary | ICD-10-CM

## 2024-01-11 DIAGNOSIS — Z3A01 Less than 8 weeks gestation of pregnancy: Secondary | ICD-10-CM

## 2024-01-11 DIAGNOSIS — O3680X0 Pregnancy with inconclusive fetal viability, not applicable or unspecified: Secondary | ICD-10-CM

## 2024-01-11 LAB — HCG, QUANTITATIVE, PREGNANCY: hCG, Beta Chain, Quant, S: 1539 m[IU]/mL — ABNORMAL HIGH (ref <5)

## 2024-01-11 NOTE — MAU Provider Note (Signed)
 S: 38 y.o. H2E4984 @[redacted]w[redacted]d  by LMP presents to MAU for repeat hcg.  She denies abdominal pain or vaginal bleeding today.    Her quant hcg on 01/09/24 was 1911 and ultrasound showed no IUP or ectopic pregnancy.  HPI  O: BP 124/77 (BP Location: Right Arm)   Pulse 64   Temp 98.8 F (37.1 C) (Oral)   Resp 19   LMP 11/22/2023   SpO2 100%   VS reviewed, nursing note reviewed,  Constitutional: well developed, well nourished, no distress HEENT: normocephalic CV: normal rate Pulm/chest wall: normal effort Abdomen: soft Neuro: alert and oriented x 3 Skin: warm, dry Psych: affect normal  Results for orders placed or performed during the hospital encounter of 01/11/24 (from the past 24 hours)  hCG, quantitative, pregnancy     Status: Abnormal   Collection Time: 01/11/24 10:46 AM  Result Value Ref Range   hCG, Beta Chain, Quant, S 1,539 (H) <5 mIU/mL       MDM: Ordered labs/reviewed results.  Quant hcg with slight decrease but not significant drop.  Consult Dr Nicholaus with assessment and findings. US  results from 9/12 and today reviewed. With detailed return/ectopic precautions, D/C home with hcg in 48 hours at [facility].   Pt stable at time of discharge.  A:  1. Vaginal bleeding affecting early pregnancy   2. Pregnancy of unknown anatomic location   3. [redacted] weeks gestation of pregnancy     P: D/C home with ectopic/bleeding precautions F/U hcg in 48 hours Return to MAU as needed for emergencies  LEFTWICH-KIRBY, Tatanisha Cuthbert, CNM 2:18 PM

## 2024-01-11 NOTE — MAU Note (Signed)
.  Connie Boyd is a 38 y.o. at [redacted]w[redacted]d here in MAU reporting: Patient states she was told to return to for blood work. She has brown discharge since Friday. She is having lower abdominal pain 7/10 which is the same as it was when she was here. Patient reports she took tylenol  and it helped some    Pain score: 7/10 There were no vitals filed for this visit.    Lab orders placed from triage:   hcg

## 2024-01-12 LAB — GC/CHLAMYDIA PROBE AMP (~~LOC~~) NOT AT ARMC
Chlamydia: NEGATIVE
Comment: NEGATIVE
Comment: NORMAL
Neisseria Gonorrhea: NEGATIVE

## 2024-01-13 ENCOUNTER — Ambulatory Visit: Payer: Self-pay

## 2024-01-13 ENCOUNTER — Other Ambulatory Visit: Payer: Self-pay

## 2024-01-13 ENCOUNTER — Ambulatory Visit: Payer: Self-pay | Admitting: Obstetrics and Gynecology

## 2024-01-13 VITALS — BP 115/78 | HR 62 | Wt 157.1 lb

## 2024-01-13 DIAGNOSIS — O3680X0 Pregnancy with inconclusive fetal viability, not applicable or unspecified: Secondary | ICD-10-CM

## 2024-01-13 DIAGNOSIS — Z3A00 Weeks of gestation of pregnancy not specified: Secondary | ICD-10-CM

## 2024-01-13 LAB — BETA HCG QUANT (REF LAB): hCG Quant: 852 m[IU]/mL

## 2024-01-13 NOTE — Progress Notes (Signed)
 Beta HCG Follow-up Visit  Connie Boyd presents to [facility] for follow-up beta HCG lab. She was seen in MAU for brown vaginal discharge, with abdominal and back pain on 01/09/24, returned 01/11/24 for repeat US  and HCG. HCG results below. No confirmed IUP. No pain today. Reports small amount of brown vaginal discharge. Discussed with patient that we are following beta HCG levels today. Results will be back in approximately 4 hours. Valid contact number for patient confirmed. I will call the patient with results.   Beta HCG results: 01/09/24 0852 1911  01/11/24 1046 1539  01/13/24 0845 852   Results and patient history reviewed with Nicholaus, MD, who states patient should return for HCG weekly until negative. Called patient to review results. Lab appt scheduled. Return precautions given. She would also like to schedule appt with provider to discuss future pregnancy vs contraception. Spanish interpreter Connie Boyd present for in-person visit and telephone encounter.  Connie Boyd 01/13/2024 8:34 AM

## 2024-01-13 NOTE — Telephone Encounter (Signed)
 Called patient with Spanish interpreter Zelda to review results. Lab appt scheduled. Return precautions given.

## 2024-01-14 ENCOUNTER — Other Ambulatory Visit: Payer: Self-pay

## 2024-01-14 DIAGNOSIS — O3680X0 Pregnancy with inconclusive fetal viability, not applicable or unspecified: Secondary | ICD-10-CM

## 2024-01-20 ENCOUNTER — Other Ambulatory Visit: Payer: Self-pay

## 2024-01-20 DIAGNOSIS — O3680X0 Pregnancy with inconclusive fetal viability, not applicable or unspecified: Secondary | ICD-10-CM

## 2024-01-21 LAB — BETA HCG QUANT (REF LAB): hCG Quant: 290 m[IU]/mL

## 2024-01-25 ENCOUNTER — Ambulatory Visit: Payer: Self-pay | Admitting: Obstetrics and Gynecology

## 2024-01-26 ENCOUNTER — Other Ambulatory Visit: Payer: Self-pay

## 2024-01-26 DIAGNOSIS — O3680X0 Pregnancy with inconclusive fetal viability, not applicable or unspecified: Secondary | ICD-10-CM

## 2024-01-26 NOTE — Telephone Encounter (Addendum)
 RN called pt using [employer] [phone].  Advise pt that hcg level has decreased and provider recommends it be checked weekly until it reaches zero.  Reminded pt of lab visit appointment 01/27/24 at 3:15pm.  Pt verbalized understanding with no further questions at this time.     Waddell, RN     ----- Message from Burnard CHRISTELLA Moats sent at 01/25/2024 10:17 PM EDT ----- Please call and let patient know Hcg has decreased, still needs to cont weekly until zero. She is spanish speaking, thainks. ----- Message ----- From: Rebecka Memos Lab Results In Sent: 01/21/2024   4:36 AM EDT To: Burnard CHRISTELLA Moats, MD

## 2024-01-27 ENCOUNTER — Other Ambulatory Visit: Payer: Self-pay

## 2024-01-27 DIAGNOSIS — O3680X0 Pregnancy with inconclusive fetal viability, not applicable or unspecified: Secondary | ICD-10-CM

## 2024-01-28 LAB — BETA HCG QUANT (REF LAB): hCG Quant: 73 m[IU]/mL

## 2024-01-31 ENCOUNTER — Ambulatory Visit: Payer: Self-pay | Admitting: Obstetrics and Gynecology

## 2024-02-02 ENCOUNTER — Encounter: Payer: Self-pay | Admitting: Student

## 2024-02-02 ENCOUNTER — Other Ambulatory Visit: Payer: Self-pay

## 2024-02-02 ENCOUNTER — Ambulatory Visit (INDEPENDENT_AMBULATORY_CARE_PROVIDER_SITE_OTHER): Payer: Self-pay | Admitting: Student

## 2024-02-02 VITALS — BP 130/84 | HR 106 | Wt 157.0 lb

## 2024-02-02 DIAGNOSIS — Z3009 Encounter for other general counseling and advice on contraception: Secondary | ICD-10-CM

## 2024-02-02 DIAGNOSIS — O039 Complete or unspecified spontaneous abortion without complication: Secondary | ICD-10-CM

## 2024-02-02 DIAGNOSIS — Z3A01 Less than 8 weeks gestation of pregnancy: Secondary | ICD-10-CM

## 2024-02-02 DIAGNOSIS — Z603 Acculturation difficulty: Secondary | ICD-10-CM

## 2024-02-02 DIAGNOSIS — Z758 Other problems related to medical facilities and other health care: Secondary | ICD-10-CM

## 2024-02-02 MED ORDER — NORGESTIMATE-ETH ESTRADIOL 0.25-35 MG-MCG PO TABS: 1.0000 | ORAL_TABLET | Freq: Every day | ORAL | 11 refills | Status: AC

## 2024-02-02 NOTE — Progress Notes (Signed)
  History:  Ms. Farhiya Rosten Kamaiyah Uselton is a 38 y.o. [id number] who presents to clinic today for follow-up beta HCG lab. She was seen in MAU for brown vaginal discharge, with abdominal and back pain on 01/09/24, returned 01/11/24 for repeat US  and HCG.  No confirmed IUP on ultrasound. Weekly HCG results trending down. Would like to start birth control pills until husband gets vasectomy.   The following portions of the patient's history were reviewed and updated as appropriate: allergies, current medications, family history, past medical history, social history, past surgical history and problem list.  Review of Systems:  Review of Systems  Constitutional: Negative.   Respiratory: Negative.    Cardiovascular: Negative.   Gastrointestinal: Negative.   Genitourinary: Negative.   Musculoskeletal: Negative.   Psychiatric/Behavioral: Negative.        Objective:  Physical Exam BP 130/84   Pulse (!) 106   Wt 157 lb (71.2 kg)   LMP 11/22/2023   Breastfeeding No   BMI 32.25 kg/m  Physical Exam Constitutional:      Appearance: Normal appearance.  Cardiovascular:     Rate and Rhythm: Normal rate.  Pulmonary:     Effort: Pulmonary effort is normal.  Abdominal:     Palpations: Abdomen is soft.  Genitourinary:    Comments: deferred Neurological:     Mental Status: She is alert.  Psychiatric:        Mood and Affect: Mood normal.     Labs and Imaging  Beta HCG results: 01/09/24 0852 1911  01/11/24 1046 1539  01/13/24 0845 852   01/20/24 0436 290  01/27/24 0336 73    No results found for this or any previous visit (from the past 24 hours).  No results found.  Health Maintenance Due  Topic Date Due   Hepatitis B Vaccines 19-59 Average Risk (1 of 3 - 19+ 3-dose series) Never done   HPV VACCINES (1 - 3-dose SCDM series) Never done   Influenza Vaccine  11/28/2023   COVID-19 Vaccine (1 - 2024-25 season) Never done    Labs, imaging and previous visits in Epic and Care Everywhere  reviewed  Assessment & Plan:  1. SAB (spontaneous abortion) (Primary) - Trending to 0 - No pain or bleeding - Beta hCG quant (ref lab)  2. General counseling and advice for contraceptive management - Counseled on proper use of OCP. Instructed to use until husband is cleared from vasectomy - Instructed to start pills after receiving rults from office - norgestimate -ethinyl estradiol  (ORTHO-CYCLEN) 0.25-35 MG-MCG tablet; Take 1 tablet by mouth daily.  Dispense: 30 tablet; Refill: 11  3. Language barrier affecting health care - Interpreter present at the bedside  Approximately 15 minutes of total time was spent with this patient on counseling and coordination of care  No follow-ups on file.  Hoyle Garre, NP 02/02/2024 10:33 AM

## 2024-02-02 NOTE — Progress Notes (Signed)
 Staff message sent to [facility] to call patient for pap smear  Rosaline, RN

## 2024-02-03 ENCOUNTER — Ambulatory Visit: Payer: Self-pay | Admitting: Obstetrics and Gynecology

## 2024-02-03 LAB — BETA HCG QUANT (REF LAB): hCG Quant: 23 m[IU]/mL

## 2024-02-04 ENCOUNTER — Ambulatory Visit: Payer: Self-pay | Admitting: Student

## 2024-02-04 DIAGNOSIS — O039 Complete or unspecified spontaneous abortion without complication: Secondary | ICD-10-CM

## 2024-02-05 NOTE — Telephone Encounter (Addendum)
-----   Message from Connie Boyd sent at 02/04/2024 11:51 PM EDT ----- Please call and let patient know Hcg has decreased, still needs to continue weekly testing until zero.  ----- Message ----- From: Rebecka Memos Lab Results In Sent: 02/03/2024   4:36 AM EDT To: Connie Dauer, NP  10/9  1410  Called pt w/interpreter Connie Boyd. She was informed of test result as well as need for continued weekly testing until hormone level is less than 5. Pt voiced understanding and agreed to lab appt on 10/14 @ 3:15.

## 2024-02-05 NOTE — Addendum Note (Signed)
 Addended by: MICHAE LOA CROME on: 02/05/2024 02:17 PM   Modules accepted: Orders

## 2024-02-10 ENCOUNTER — Other Ambulatory Visit: Payer: Self-pay

## 2024-02-10 DIAGNOSIS — O039 Complete or unspecified spontaneous abortion without complication: Secondary | ICD-10-CM

## 2024-02-11 LAB — BETA HCG QUANT (REF LAB): hCG Quant: 6 m[IU]/mL

## 2024-02-12 ENCOUNTER — Ambulatory Visit: Payer: Self-pay | Admitting: Student

## 2024-02-16 NOTE — Telephone Encounter (Signed)
 I called Edelin with [employer] and informed her of results and recommendations. She voices understanding. Rock Skip PEAK

## 2024-02-16 NOTE — Telephone Encounter (Signed)
-----   Message from Nat Dauer sent at 02/12/2024 11:11 PM EDT ----- Patient can stop trending Beta. She is cleared to start her birth control pills with the reminder that they are not effective until completing 7 straight days. Patient will need an interpreter for  results.  ----- Message ----- From: Rebecka Memos Lab Results In Sent: 02/11/2024   4:36 AM EDT To: Nicole Forlan, NP

## 2024-06-08 ENCOUNTER — Ambulatory Visit: Payer: Self-pay | Admitting: Radiology
